# Patient Record
Sex: Female | Born: 2000 | Race: Black or African American | Hispanic: Yes | Marital: Single | Smoking: Never smoker
Health system: Southern US, Community
[De-identification: ages and names within clinical notes are randomized; demographics above are authoritative.]

## PROBLEM LIST (undated history)

## (undated) DIAGNOSIS — N390 Urinary tract infection, site not specified: Secondary | ICD-10-CM

## (undated) HISTORY — DX: Urinary tract infection, site not specified: N39.0

---
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
## 2020-09-03 ENCOUNTER — Other Ambulatory Visit: Payer: Self-pay

## 2020-09-03 ENCOUNTER — Emergency Department (HOSPITAL_BASED_OUTPATIENT_CLINIC_OR_DEPARTMENT_OTHER)
Admission: EM | Admit: 2020-09-03 | Discharge: 2020-09-03 | Discharge status: Against Medical Advice | Payer: Medicaid Other | Attending: Emergency Medicine | Admitting: Emergency Medicine

## 2020-09-03 ENCOUNTER — Encounter (HOSPITAL_BASED_OUTPATIENT_CLINIC_OR_DEPARTMENT_OTHER): Payer: Self-pay | Admitting: Emergency Medicine

## 2020-09-03 DIAGNOSIS — Z5321 Procedure and treatment not carried out due to patient leaving prior to being seen by health care provider: Secondary | ICD-10-CM | POA: Diagnosis not present

## 2020-09-03 DIAGNOSIS — N764 Abscess of vulva: Secondary | ICD-10-CM | POA: Diagnosis present

## 2020-09-03 LAB — PREGNANCY, URINE: Preg Test, Ur: NEGATIVE

## 2020-09-03 NOTE — ED Triage Notes (Addendum)
Reports a bump on her vagina that is sore.  Thinks its an abscess.  Noticed 4 days ago.  Also reports wanting to be tested for std's.  Reports a creamy discharge.  Was told something was going around.

## 2020-09-03 NOTE — ED Provider Notes (Signed)
Patient eloped prior to my examination.   Lorelee New, PA-C 09/03/20 1830    Rolan Bucco, MD 09/03/20 2227

## 2020-09-03 NOTE — ED Notes (Signed)
Patient called for room assignment. No answer

## 2020-09-05 ENCOUNTER — Encounter (HOSPITAL_BASED_OUTPATIENT_CLINIC_OR_DEPARTMENT_OTHER): Payer: Self-pay

## 2020-09-05 ENCOUNTER — Other Ambulatory Visit: Payer: Self-pay

## 2020-09-05 ENCOUNTER — Emergency Department (HOSPITAL_BASED_OUTPATIENT_CLINIC_OR_DEPARTMENT_OTHER)
Admission: EM | Admit: 2020-09-05 | Discharge: 2020-09-05 | Disposition: A | Discharge status: Home / Self Care | Payer: Medicaid Other | Attending: Emergency Medicine | Admitting: Emergency Medicine

## 2020-09-05 DIAGNOSIS — N751 Abscess of Bartholin's gland: Secondary | ICD-10-CM | POA: Diagnosis not present

## 2020-09-05 DIAGNOSIS — N898 Other specified noninflammatory disorders of vagina: Secondary | ICD-10-CM | POA: Diagnosis present

## 2020-09-05 DIAGNOSIS — N758 Other diseases of Bartholin's gland: Secondary | ICD-10-CM

## 2020-09-05 LAB — WET PREP, GENITAL
Trich, Wet Prep: NONE SEEN
Yeast Wet Prep HPF POC: NONE SEEN

## 2020-09-05 LAB — URINALYSIS, ROUTINE W REFLEX MICROSCOPIC
Bilirubin Urine: NEGATIVE
Glucose, UA: NEGATIVE mg/dL
Ketones, ur: NEGATIVE mg/dL
Leukocytes,Ua: NEGATIVE
Nitrite: NEGATIVE
Protein, ur: NEGATIVE mg/dL
Specific Gravity, Urine: 1.02 (ref 1.005–1.030)
pH: 7 (ref 5.0–8.0)

## 2020-09-05 LAB — URINALYSIS, MICROSCOPIC (REFLEX)

## 2020-09-05 LAB — PREGNANCY, URINE: Preg Test, Ur: NEGATIVE

## 2020-09-05 MED ORDER — LIDOCAINE-EPINEPHRINE (PF) 2 %-1:200000 IJ SOLN
10.0000 mL | Freq: Once | INTRAMUSCULAR | Status: AC
  Administered 2020-09-05: 10 mL
  Filled 2020-09-05: qty 20

## 2020-09-05 NOTE — ED Provider Notes (Signed)
[facility] EMERGENCY DEPARTMENT Provider Note   CSN: [id number] Arrival date & time: 09/05/20  0998     History Chief Complaint  Patient presents with  . Abscess    Renee [name] is a 20 y.o. female.  She has no significant past medical history.  Complaining of a bump in her vaginal area has been going on for a few days.  She is sexually active.  She is having some mild white discharge that is typical for her.  No bleeding.  Last menstrual period 3 weeks ago.  No fevers chills cough chest pain shortness of breath abdominal pain vomiting diarrhea  The history is provided by the patient.  Female GU Problem This is a new problem. The current episode started more than 2 days ago. The problem occurs constantly. The problem has been gradually worsening. Pertinent negatives include no chest pain, no abdominal pain, no headaches and no shortness of breath. Nothing aggravates the symptoms. Nothing relieves the symptoms. She has tried nothing for the symptoms. The treatment provided no relief.       History reviewed. No pertinent past medical history.  There are no problems to display for this patient.   History reviewed. No pertinent surgical history.   OB History   No obstetric history on file.     History reviewed. No pertinent family history.  Social History   Tobacco Use  . Smoking status: Never Smoker  . Smokeless tobacco: Never Used  Vaping Use  . Vaping Use: Never used  Substance Use Topics  . Alcohol use: Yes  . Drug use: Yes    Types: Marijuana    Home Medications Prior to Admission medications   Not on File    Allergies    Patient has no known allergies.  Review of Systems   Review of Systems  Constitutional: Negative for fever.  HENT: Negative for sore throat.   Eyes: Negative for visual disturbance.  Respiratory: Negative for shortness of breath.   Cardiovascular: Negative for chest pain.  Gastrointestinal: Negative for abdominal pain.   Genitourinary: Negative for dysuria.  Musculoskeletal: Negative for back pain.  Skin: Negative for rash.  Neurological: Negative for headaches.    Physical Exam Updated Vital Signs BP 132/74 (BP Location: Right Arm)   Pulse 90   Temp 98.3 F (36.8 C) (Oral)   Resp 16   Ht 5\' 2"  (1.575 m)   Wt 53.1 kg   LMP 08/09/2020   SpO2 100%   BMI 21.40 kg/m   Physical Exam Vitals and nursing note reviewed. Exam conducted with a chaperone present.  Constitutional:      General: She is not in acute distress.    Appearance: She is well-developed.  HENT:     Head: Normocephalic and atraumatic.  Eyes:     Conjunctiva/sclera: Conjunctivae normal.  Cardiovascular:     Rate and Rhythm: Normal rate and regular rhythm.     Heart sounds: No murmur heard.   Pulmonary:     Effort: Pulmonary effort is normal. No respiratory distress.     Breath sounds: Normal breath sounds.  Abdominal:     Palpations: Abdomen is soft.     Tenderness: There is no abdominal tenderness.  Genitourinary:    Comments: Normal external genitalia.  Scant white discharge in vault.  Tender to centimeter mass right vulvar area consistent with Bartholin cyst.  I&D of same.  Purulent discharge.  See I&D procedure note.  Nurse 08/11/2020 present and chaperoning for exam. Musculoskeletal:  General: Normal range of motion.     Cervical back: Neck supple.  Skin:    General: Skin is warm and dry.  Neurological:     Mental Status: She is alert.     ED Results / Procedures / Treatments   Labs (all labs ordered are listed, but only abnormal results are displayed) Labs Reviewed  WET PREP, GENITAL - Abnormal; Notable for the following components:      Result Value   Clue Cells Wet Prep HPF POC PRESENT (*)    WBC, Wet Prep HPF POC MODERATE (*)    All other components within normal limits  URINALYSIS, ROUTINE W REFLEX MICROSCOPIC - Abnormal; Notable for the following components:   APPearance CLOUDY (*)    Hgb urine  dipstick TRACE (*)    All other components within normal limits  URINALYSIS, MICROSCOPIC (REFLEX) - Abnormal; Notable for the following components:   Bacteria, UA FEW (*)    All other components within normal limits  PREGNANCY, URINE  GC/CHLAMYDIA PROBE AMP ([place]) NOT AT [facility]    EKG None  Radiology No results found.  Procedures .Marland KitchenIncision and Drainage  Date/Time: 09/05/2020 7:43 AM Performed by: Terrilee Files, MD Authorized by: Terrilee Files, MD   Consent:    Consent obtained:  Verbal   Consent given by:  Patient   Risks discussed:  Bleeding, incomplete drainage, pain and infection   Alternatives discussed:  No treatment, delayed treatment and referral Universal protocol:    Procedure explained and questions answered to patient or proxy's satisfaction: yes     Patient identity confirmed:  Verbally with patient Location:    Type:  Bartholin cyst   Size:  3   Location:  Anogenital   Anogenital location:  Bartholin's gland Pre-procedure details:    Skin preparation:  Povidone-iodine Sedation:    Sedation type:  None Anesthesia:    Anesthesia method:  Local infiltration   Local anesthetic:  Lidocaine 2% WITH epi Procedure type:    Complexity:  Complex Procedure details:    Ultrasound guidance: no     Incision types:  Single straight   Incision depth:  Subcutaneous   Wound management:  Probed and deloculated   Drainage:  Purulent   Drainage amount:  Moderate   Wound treatment:  Wound left open   Packing materials:  None Post-procedure details:    Procedure completion:  Tolerated well, no immediate complications     Medications Ordered in ED Medications  lidocaine-EPINEPHrine (XYLOCAINE W/EPI) 2 %-1:200000 (PF) injection 10 mL (has no administration in time range)    ED Course  I have reviewed the triage vital signs and the nursing notes.  Pertinent labs & imaging results that were available during my care of the patient were reviewed by me  and considered in my medical decision making (see chart for details).    MDM Rules/Calculators/A&P                         Differential diagnosis includes Bartholin cyst, abscess, UTI, pregnancy  Final Clinical Impression(s) / ED Diagnoses Final diagnoses:  Bartholin's gland infection    Rx / DC Orders ED Discharge Orders    None       Terrilee Files, MD 09/05/20 [phone]

## 2020-09-05 NOTE — ED Triage Notes (Signed)
Pt reports bump to vaginal area that is sore. Also reports creamy vaginal discharge. Is wanting to get tested for STIs.

## 2020-09-06 LAB — GC/CHLAMYDIA PROBE AMP (~~LOC~~) NOT AT ARMC
Chlamydia: NEGATIVE
Comment: NEGATIVE
Comment: NORMAL
Neisseria Gonorrhea: NEGATIVE

## 2021-03-27 ENCOUNTER — Emergency Department (HOSPITAL_BASED_OUTPATIENT_CLINIC_OR_DEPARTMENT_OTHER): Payer: Medicaid Other

## 2021-03-27 ENCOUNTER — Other Ambulatory Visit: Payer: Self-pay

## 2021-03-27 ENCOUNTER — Encounter (HOSPITAL_BASED_OUTPATIENT_CLINIC_OR_DEPARTMENT_OTHER): Payer: Self-pay | Admitting: *Deleted

## 2021-03-27 ENCOUNTER — Emergency Department (HOSPITAL_BASED_OUTPATIENT_CLINIC_OR_DEPARTMENT_OTHER)
Admission: EM | Admit: 2021-03-27 | Discharge: 2021-03-27 | Disposition: A | Discharge status: Home / Self Care | Payer: Medicaid Other | Attending: Emergency Medicine | Admitting: Emergency Medicine

## 2021-03-27 DIAGNOSIS — R102 Pelvic and perineal pain: Secondary | ICD-10-CM | POA: Diagnosis present

## 2021-03-27 DIAGNOSIS — N898 Other specified noninflammatory disorders of vagina: Secondary | ICD-10-CM

## 2021-03-27 DIAGNOSIS — N83209 Unspecified ovarian cyst, unspecified side: Secondary | ICD-10-CM

## 2021-03-27 LAB — URINALYSIS, ROUTINE W REFLEX MICROSCOPIC
Bilirubin Urine: NEGATIVE
Glucose, UA: NEGATIVE mg/dL
Hgb urine dipstick: NEGATIVE
Ketones, ur: NEGATIVE mg/dL
Leukocytes,Ua: NEGATIVE
Nitrite: NEGATIVE
Protein, ur: NEGATIVE mg/dL
Specific Gravity, Urine: 1.025 (ref 1.005–1.030)
pH: 7.5 (ref 5.0–8.0)

## 2021-03-27 LAB — WET PREP, GENITAL
Sperm: NONE SEEN
Trich, Wet Prep: NONE SEEN
WBC, Wet Prep HPF POC: >=10 — AB (ref <10)
Yeast Wet Prep HPF POC: NONE SEEN

## 2021-03-27 LAB — PREGNANCY, URINE: Preg Test, Ur: NEGATIVE

## 2021-03-27 MED ORDER — METRONIDAZOLE 500 MG PO TABS: 500.0000 mg | ORAL_TABLET | Freq: Two times a day (BID) | ORAL | 0 refills | Status: DC

## 2021-03-27 NOTE — Discharge Instructions (Addendum)
It was our pleasure to provide your ER care today - we hope that you feel better.  Take acetaminophen or ibuprofen as need. Take antibiotic as prescribed.   Your ultrasound shows a small ovarian cyst - follow up with primary care doctor and gyn doctor. Redemonstrated cyst between the right and left ovaries measuring 1.3 cm, possible paraovarian cyst  Follow up with primary care doctor in the coming week for recheck.  Return to ER if worse, new symptoms, fevers, new or severe pain, persistent vomiting, or other concern.

## 2021-03-27 NOTE — ED Triage Notes (Signed)
C/o pelvic " pressure" x 1 week , denies vaginal discharge

## 2021-03-27 NOTE — ED Provider Notes (Signed)
[facility] EMERGENCY DEPARTMENT Provider Note   CSN: [id number] Arrival date & time: 03/27/21  1541     History Chief Complaint  Patient presents with   Pelvic Pain    Renee [name] is a 20 y.o. female.  Patient c/o bilateral pelvic pain in past week. Symptoms gradual onset, constant, moderate, dull, non radiating. No nausea or vomiting. No abd distension. No diarrhea or constipation. No fever or chills. Hx ovarian cyst. No current vaginal discharge or bleeding. Is sexually active, no known std exposure. No hx endometriosis or fibroids. Normal appetite. No back or flank pain. No dysuria. Lnmp 2 week ago.   The history is provided by the patient and medical records.  Pelvic Pain Associated symptoms include abdominal pain. Pertinent negatives include no chest pain, no headaches and no shortness of breath.      History reviewed. No pertinent past medical history.  There are no problems to display for this patient.   History reviewed. No pertinent surgical history.   OB History   No obstetric history on file.     No family history on file.  Social History   Tobacco Use   Smoking status: Never   Smokeless tobacco: Never  Vaping Use   Vaping Use: Never used  Substance Use Topics   Alcohol use: Yes   Drug use: Yes    Types: Marijuana    Home Medications Prior to Admission medications   Not on File    Allergies    Patient has no known allergies.  Review of Systems   Review of Systems  Constitutional:  Negative for chills and fever.  HENT:  Negative for sore throat.   Eyes:  Negative for redness.  Respiratory:  Negative for shortness of breath.   Cardiovascular:  Negative for chest pain.  Gastrointestinal:  Positive for abdominal pain. Negative for diarrhea, nausea and vomiting.  Genitourinary:  Positive for pelvic pain. Negative for dysuria, flank pain, urgency, vaginal bleeding and vaginal discharge.  Musculoskeletal:  Negative for back pain  and neck pain.  Skin:  Negative for rash.  Neurological:  Negative for headaches.  Hematological:  Does not bruise/bleed easily.  Psychiatric/Behavioral:  Negative for confusion.    Physical Exam Updated Vital Signs BP 124/81 (BP Location: Right Arm)   Pulse 71   Temp 98.7 F (37.1 C) (Oral)   Resp 16   Ht 1.575 m (5\' 2" )   Wt 54.4 kg   LMP 03/16/2021   BMI 21.95 kg/m   Physical Exam Vitals and nursing note reviewed.  Constitutional:      Appearance: Normal appearance. She is well-developed.  HENT:     Head: Atraumatic.     Nose: Nose normal.     Mouth/Throat:     Mouth: Mucous membranes are moist.  Eyes:     General: No scleral icterus.    Conjunctiva/sclera: Conjunctivae normal.  Neck:     Trachea: No tracheal deviation.  Cardiovascular:     Rate and Rhythm: Normal rate and regular rhythm.     Pulses: Normal pulses.     Heart sounds: Normal heart sounds. No murmur heard.   No friction rub. No gallop.  Pulmonary:     Effort: Pulmonary effort is normal. No respiratory distress.     Breath sounds: Normal breath sounds.  Abdominal:     General: Bowel sounds are normal. There is no distension.     Palpations: Abdomen is soft. There is no mass.     Tenderness:  There is no abdominal tenderness. There is no guarding or rebound.     Hernia: No hernia is present.  Genitourinary:    Comments: No cva tenderness. Mild whitish d/c. No CMT. No adnexal masses or tenderness noted.  Musculoskeletal:        General: No swelling.     Cervical back: Normal range of motion and neck supple. No rigidity. No muscular tenderness.  Skin:    General: Skin is warm and dry.     Findings: No rash.  Neurological:     Mental Status: She is alert.     Comments: Alert, speech normal.   Psychiatric:        Mood and Affect: Mood normal.    ED Results / Procedures / Treatments   Labs (all labs ordered are listed, but only abnormal results are displayed) Results for orders placed or  performed during the hospital encounter of 03/27/21  Wet prep, genital   Specimen: Genital  Result Value Ref Range   Yeast Wet Prep HPF POC NONE SEEN NONE SEEN   Trich, Wet Prep NONE SEEN NONE SEEN   Clue Cells Wet Prep HPF POC PRESENT (A) NONE SEEN   WBC, Wet Prep HPF POC >=10 (A) <10   Sperm NONE SEEN   Pregnancy, urine  Result Value Ref Range   Preg Test, Ur NEGATIVE NEGATIVE  Urinalysis, Routine w reflex microscopic  Result Value Ref Range   Color, Urine YELLOW YELLOW   APPearance CLOUDY (A) CLEAR   Specific Gravity, Urine 1.025 1.005 - 1.030   pH 7.5 5.0 - 8.0   Glucose, UA NEGATIVE NEGATIVE mg/dL   Hgb urine dipstick NEGATIVE NEGATIVE   Bilirubin Urine NEGATIVE NEGATIVE   Ketones, ur NEGATIVE NEGATIVE mg/dL   Protein, ur NEGATIVE NEGATIVE mg/dL   Nitrite NEGATIVE NEGATIVE   Leukocytes,Ua NEGATIVE NEGATIVE    EKG None  Radiology US PELVIC COMPLETE W TRANSVAGINAL AND TORSION R/O  Result Date: 03/27/2021 CLINICAL DATA:  Pelvic pain EXAM: TRANSABDOMINAL AND TRANSVAGINAL ULTRASOUND OF PELVIS DOPPLER ULTRASOUND OF OVARIES TECHNIQUE: Both transabdominal and transvaginal ultrasound examinations of the pelvis were performed. Transabdominal technique was performed for global imaging of the pelvis including uterus, ovaries, adnexal regions, and pelvic cul-de-sac. It was necessary to proceed with endovaginal exam following the transabdominal exam to visualize the uterus endometrium ovaries. Color and duplex Doppler ultrasound was utilized to evaluate blood flow to the ovaries. COMPARISON:  Ultrasound 05/11/2019 FINDINGS: Uterus Measurements: 5.8 x 3.7 x 4 cm = volume: 45 mL. No fibroids or other mass visualized. Endometrium Thickness: 5.3.  No focal abnormality visualized. Right ovary Measurements: 3.3 x 1.7 x 3.9 cm = volume: 11.6 mL. Normal appearance/no adnexal mass. Left ovary Measurements: 3.5 x 2.5 x 3.3 cm = volume: 15.3 mL. Normal appearance/no adnexal mass. Pulsed Doppler  evaluation of both ovaries demonstrates normal low-resistance arterial and venous waveforms. Other findings Small free fluid in the pelvis. Redemonstrated cyst between the right and left ovaries measuring 1.3 cm, possible paraovarian cyst, no further follow-up recommended. IMPRESSION: Small free fluid in the pelvis.  Negative for torsion. Electronically Signed   By: Jasmine Pang M.D.   On: 03/27/2021 19:38    Procedures Procedures   Medications Ordered in ED Medications - No data to display  ED Course  I have reviewed the triage vital signs and the nursing notes.  Pertinent labs & imaging results that were available during my care of the patient were reviewed by me and considered in my medical  decision making (see chart for details).    MDM Rules/Calculators/A&P                          Labs sent.   Reviewed nursing notes and prior charts for additional history. Prior u/s w small ovarian cysts. Prior gc/ch tests negative.   Labs reviewed/interpreted by me -  u preg neg. Ua neg for uti.   U/s reviewed/interpreted by me - small ovarian cyst.  Recheck abd soft nt. Vitals normal.  Pt appears stable for d/c.   Rec pcp/gyn outpatient f/u.  Return precautions provided.      Final Clinical Impression(s) / ED Diagnoses Final diagnoses:  None    Rx / DC Orders ED Discharge Orders     None        Cathren Laine, MD 03/27/21 2130

## 2021-03-28 LAB — GC/CHLAMYDIA PROBE AMP (~~LOC~~) NOT AT ARMC
Chlamydia: NEGATIVE
Comment: NEGATIVE
Comment: NORMAL
Neisseria Gonorrhea: NEGATIVE

## 2021-06-19 ENCOUNTER — Other Ambulatory Visit: Payer: Self-pay

## 2021-06-19 ENCOUNTER — Encounter (HOSPITAL_BASED_OUTPATIENT_CLINIC_OR_DEPARTMENT_OTHER): Payer: Self-pay | Admitting: Emergency Medicine

## 2021-06-19 ENCOUNTER — Emergency Department (HOSPITAL_BASED_OUTPATIENT_CLINIC_OR_DEPARTMENT_OTHER)
Admission: EM | Admit: 2021-06-19 | Discharge: 2021-06-19 | Disposition: A | Discharge status: Home / Self Care | Payer: Medicaid Other | Attending: Emergency Medicine | Admitting: Emergency Medicine

## 2021-06-19 DIAGNOSIS — N898 Other specified noninflammatory disorders of vagina: Secondary | ICD-10-CM | POA: Insufficient documentation

## 2021-06-19 DIAGNOSIS — Z5321 Procedure and treatment not carried out due to patient leaving prior to being seen by health care provider: Secondary | ICD-10-CM | POA: Insufficient documentation

## 2021-06-19 DIAGNOSIS — Z202 Contact with and (suspected) exposure to infections with a predominantly sexual mode of transmission: Secondary | ICD-10-CM | POA: Diagnosis not present

## 2021-06-19 NOTE — ED Triage Notes (Signed)
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  No known outbreaks.  Pt states she wants to be tested for STD's in general.  Some vaginal discharge.  No itching.  No known fever.

## 2021-09-13 ENCOUNTER — Emergency Department (HOSPITAL_BASED_OUTPATIENT_CLINIC_OR_DEPARTMENT_OTHER)
Admission: EM | Admit: 2021-09-13 | Discharge: 2021-09-13 | Disposition: A | Discharge status: Home / Self Care | Payer: Medicaid Other | Attending: Emergency Medicine | Admitting: Emergency Medicine

## 2021-09-13 ENCOUNTER — Emergency Department (HOSPITAL_BASED_OUTPATIENT_CLINIC_OR_DEPARTMENT_OTHER): Payer: Medicaid Other

## 2021-09-13 ENCOUNTER — Other Ambulatory Visit: Payer: Self-pay

## 2021-09-13 ENCOUNTER — Encounter (HOSPITAL_BASED_OUTPATIENT_CLINIC_OR_DEPARTMENT_OTHER): Payer: Self-pay | Admitting: Emergency Medicine

## 2021-09-13 DIAGNOSIS — L089 Local infection of the skin and subcutaneous tissue, unspecified: Secondary | ICD-10-CM | POA: Diagnosis not present

## 2021-09-13 DIAGNOSIS — R519 Headache, unspecified: Secondary | ICD-10-CM | POA: Diagnosis present

## 2021-09-13 LAB — BASIC METABOLIC PANEL
Anion gap: 8 (ref 5–15)
BUN: 7 mg/dL (ref 6–20)
CO2: 27 mmol/L (ref 22–32)
Calcium: 9.1 mg/dL (ref 8.9–10.3)
Chloride: 101 mmol/L (ref 98–111)
Creatinine, Ser: 0.69 mg/dL (ref 0.44–1.00)
GFR, Estimated: >60 mL/min (ref >60)
Glucose, Bld: 86 mg/dL (ref 70–99)
Potassium: 4.2 mmol/L (ref 3.5–5.1)
Sodium: 136 mmol/L (ref 135–145)

## 2021-09-13 LAB — CBC WITH DIFFERENTIAL/PLATELET
Abs Immature Granulocytes: 0.03 10*3/uL (ref 0.00–0.07)
Basophils Absolute: 0.1 10*3/uL (ref 0.0–0.1)
Basophils Relative: 1 %
Eosinophils Absolute: 0.1 10*3/uL (ref 0.0–0.5)
Eosinophils Relative: 0 %
HCT: 40.5 % (ref 36.0–46.0)
Hemoglobin: 13.9 g/dL (ref 12.0–15.0)
Immature Granulocytes: 0 %
Lymphocytes Relative: 28 %
Lymphs Abs: 3.2 10*3/uL (ref 0.7–4.0)
MCH: 29.4 pg (ref 26.0–34.0)
MCHC: 34.3 g/dL (ref 30.0–36.0)
MCV: 85.8 fL (ref 80.0–100.0)
Monocytes Absolute: 1 10*3/uL (ref 0.1–1.0)
Monocytes Relative: 8 %
Neutro Abs: 7.2 10*3/uL (ref 1.7–7.7)
Neutrophils Relative %: 63 %
Platelets: 303 10*3/uL (ref 150–400)
RBC: 4.72 MIL/uL (ref 3.87–5.11)
RDW: 13.2 % (ref 11.5–15.5)
WBC: 11.5 10*3/uL — ABNORMAL HIGH (ref 4.0–10.5)
nRBC: 0 % (ref 0.0–0.2)

## 2021-09-13 MED ORDER — HYDROCODONE-ACETAMINOPHEN 5-325 MG PO TABS: 1.0000 | ORAL_TABLET | Freq: Four times a day (QID) | ORAL | 0 refills | Status: DC | PRN

## 2021-09-13 MED ORDER — AMOXICILLIN-POT CLAVULANATE 875-125 MG PO TABS: 1.0000 | ORAL_TABLET | Freq: Two times a day (BID) | ORAL | 0 refills | Status: DC

## 2021-09-13 MED ORDER — IOHEXOL 300 MG/ML  SOLN
75.0000 mL | Freq: Once | INTRAMUSCULAR | Status: AC | PRN
Start: 2021-09-13 — End: 2021-09-13
  Administered 2021-09-13: 75 mL via INTRAVENOUS
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
## 2021-09-13 NOTE — ED Provider Notes (Signed)
[facility] EMERGENCY DEPARTMENT Provider Note   CSN: [id number] Arrival date & time: 09/13/21  1941     History  Chief Complaint  Patient presents with   Facial Pain    Renee [name] is a 21 y.o. female.  Presents emergency department chief complaint of left facial swelling and pain.  Patient states that week ago her dogs were fighting and she got appalled to the left side of her face.  She was doing well the next day but then began having swelling which has progressively worsened.  She has associated trismus, difficulty sleeping due to pain.  She feels like she is having abnormal movement in her jaw when she tries to open it.  She denies sore throat, ear pain, difficulty swallowing, fever or chills.  HPI     Home Medications Prior to Admission medications   Medication Sig Start Date End Date Taking? Authorizing Provider  metroNIDAZOLE (FLAGYL) 500 MG tablet Take 1 tablet (500 mg total) by mouth 2 (two) times daily. 03/27/21   Lajean Saver, MD      Allergies    Patient has no known allergies.    Review of Systems   Review of Systems  Physical Exam Updated Vital Signs BP 131/72   Pulse 85   Temp 98.8 F (37.1 C) (Oral)   Resp 16   Ht 5\' 1"  (1.549 m)   Wt 54.4 kg   LMP 08/30/2021   SpO2 99%   BMI 22.67 kg/m  Physical Exam Vitals and nursing note reviewed.  Constitutional:      General: She is not in acute distress.    Appearance: She is well-developed. She is not diaphoretic.  HENT:     Head: Normocephalic and atraumatic.     Comments: Swelling over the left parotid region, 2 finger trismus, visualized portions of the palate show no significant swelling or tonsil swelling or exudate.    Right Ear: External ear normal.     Left Ear: External ear normal.     Nose: Nose normal.     Mouth/Throat:     Mouth: Mucous membranes are moist.  Eyes:     General: No scleral icterus.    Conjunctiva/sclera: Conjunctivae normal.  Cardiovascular:     Rate and  Rhythm: Normal rate and regular rhythm.     Heart sounds: Normal heart sounds. No murmur heard.   No friction rub. No gallop.  Pulmonary:     Effort: Pulmonary effort is normal. No respiratory distress.     Breath sounds: Normal breath sounds.  Abdominal:     General: Bowel sounds are normal. There is no distension.     Palpations: Abdomen is soft. There is no mass.     Tenderness: There is no abdominal tenderness. There is no guarding.  Musculoskeletal:     Cervical back: Normal range of motion.  Skin:    General: Skin is warm and dry.  Neurological:     Mental Status: She is alert and oriented to person, place, and time.  Psychiatric:        Behavior: Behavior normal.    ED Results / Procedures / Treatments   Labs (all labs ordered are listed, but only abnormal results are displayed) Labs Reviewed - No data to display  EKG None  Radiology No results found.  Procedures Procedures    Medications Ordered in ED Medications - No data to display  ED Course/ Medical Decision Making/ A&P Clinical Course as of 09/13/21 2345  Wed  Sep 13, 2021  2341 WBC(!): 11.5 [AH]  123XX123 Basic metabolic panel [AH]  123XX123 CT Soft Tissue Neck W Contrast I visualized and interpreted the CT soft tissue of the neck came there is inflammatory stranding in the left cheek, no evidence of parotitis fluid collection suggestive of abscess [AH]    Clinical Course User Index [AH] Margarita Mail, PA-C                           Medical Decision Making 21 year old female who got a dog ball to the face 1 week ago with worsening swelling.  After review of all data points she appears to have inflammatory stranding in the left cheek.  This is likely infectious in etiology.  Patient be treated with Augmentin.  She has no evidence of parotitis, dental infection.  PDMP reviewed and pain meds ordered as patient has significant trismus, throbbing facial pain and difficulty sleeping.  Discussed outpatient follow-up  and return  Problems Addressed: Facial infection: acute illness or injury  Amount and/or Complexity of Data Reviewed Labs: ordered. Radiology: ordered.  Risk Prescription drug management.      Final Clinical Impression(s) / ED Diagnoses Final diagnoses:  Facial infection    Rx / DC Orders ED Discharge Orders     None         Margarita Mail, PA-C 09/13/21 2347    Drenda Freeze, MD 09/14/21 1556

## 2021-09-13 NOTE — Discharge Instructions (Signed)
Get help right away if: Your symptoms get worse. You feel very sleepy. You develop vomiting or diarrhea that persists. You notice red streaks coming from the infected area. Your red area gets larger or turns dark in color. 

## 2021-12-06 IMAGING — US US PELVIS COMPLETE TRANSABD/TRANSVAG W DUPLEX
1 series · 13 of 25 positions shown · non-contrast
Comparison: Ultrasound 05/11/2019

CLINICAL DATA: Pelvic pain

EXAM:
TRANSABDOMINAL AND TRANSVAGINAL ULTRASOUND OF PELVIS
DOPPLER ULTRASOUND OF OVARIES
TECHNIQUE: Both transabdominal and transvaginal ultrasound examinations of the
pelvis were performed. Transabdominal technique was performed for
global imaging of the pelvis including uterus, ovaries, adnexal
regions, and pelvic cul-de-sac.
It was necessary to proceed with endovaginal exam following the
transabdominal exam to visualize the uterus endometrium ovaries.
Color and duplex Doppler ultrasound was utilized to evaluate blood
flow to the ovaries.

[Series 1: us pelvis complete transabd/transvag w duplex · 13 of 121 slices shown]
[im 1/121]
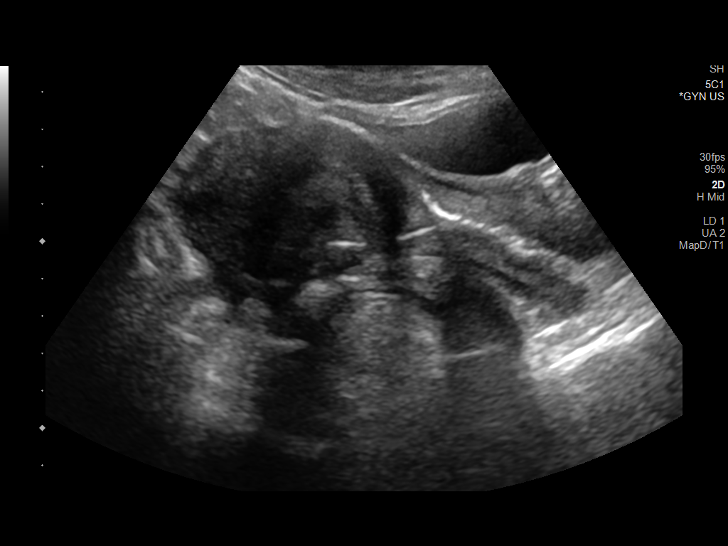
[im 11/121]
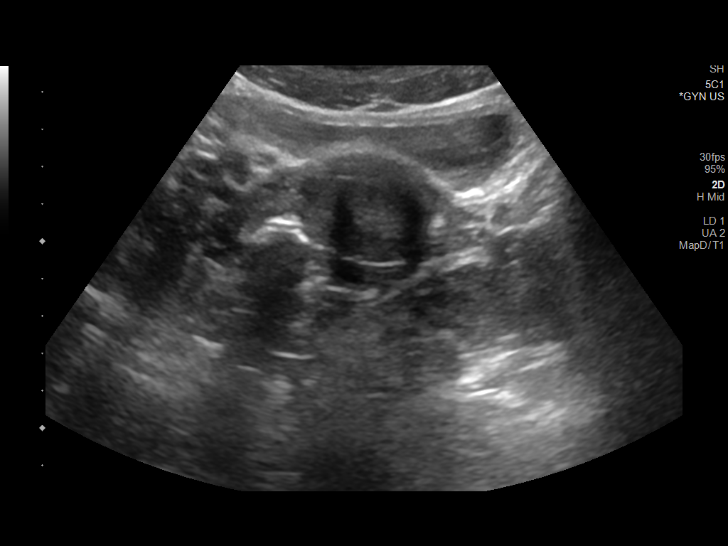
[im 21/121]
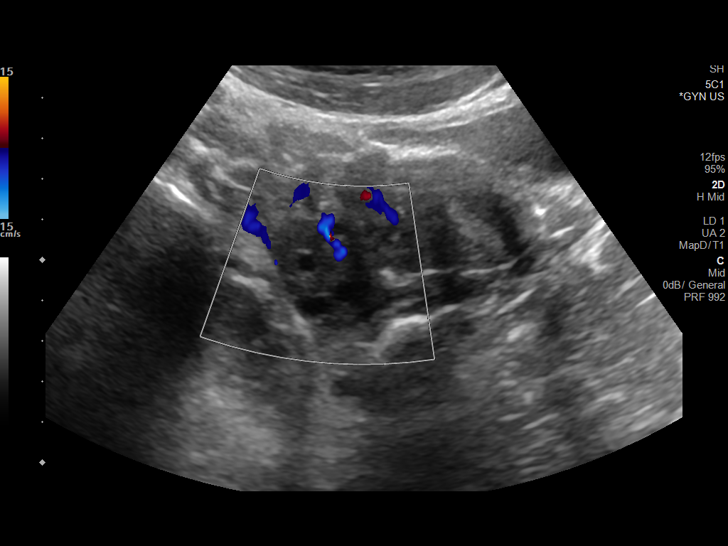
[im 31/121]
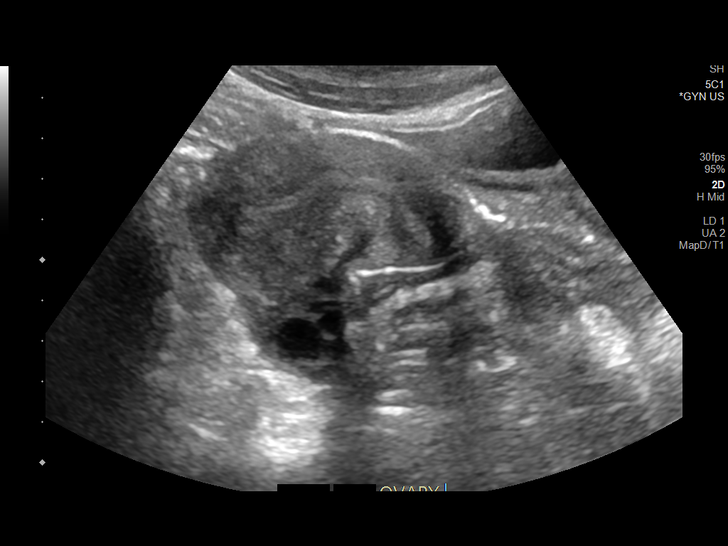
[im 41/121]
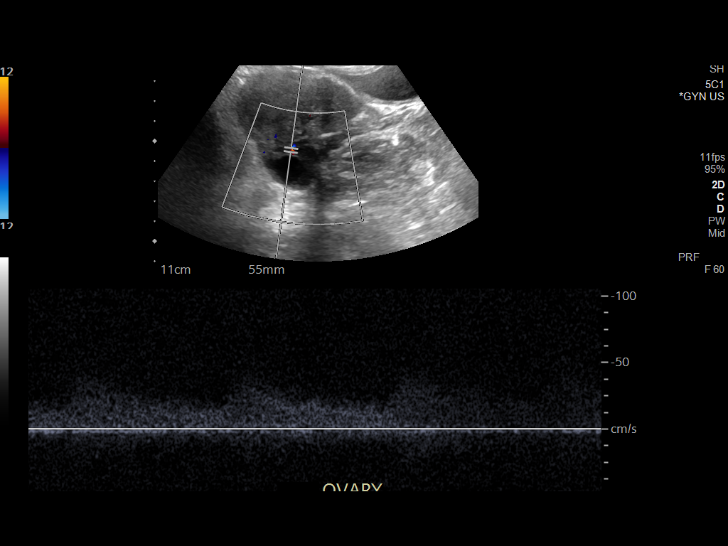
[im 51/121]
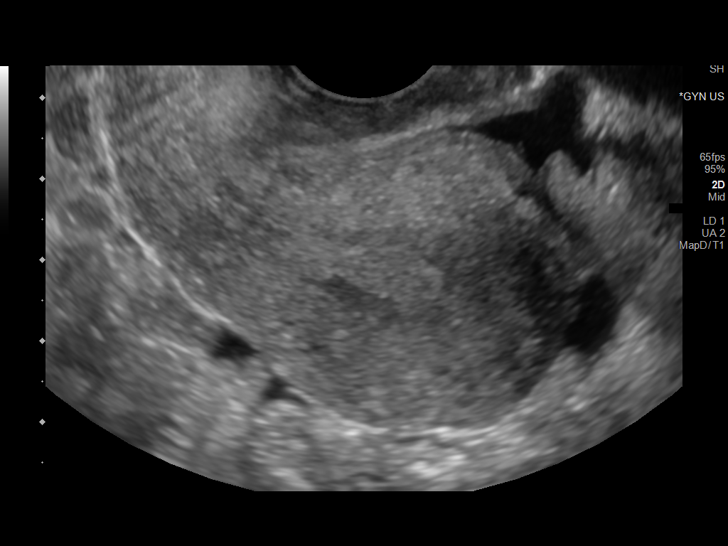
[im 61/121]
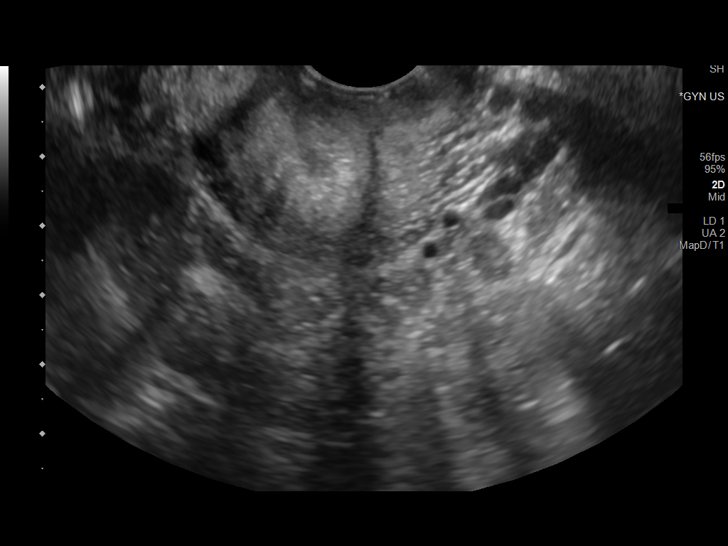
[im 71/121]
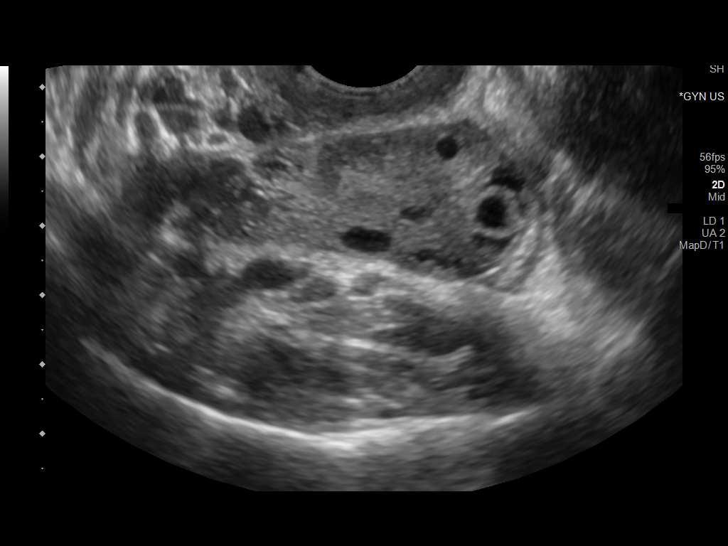
[im 81/121]
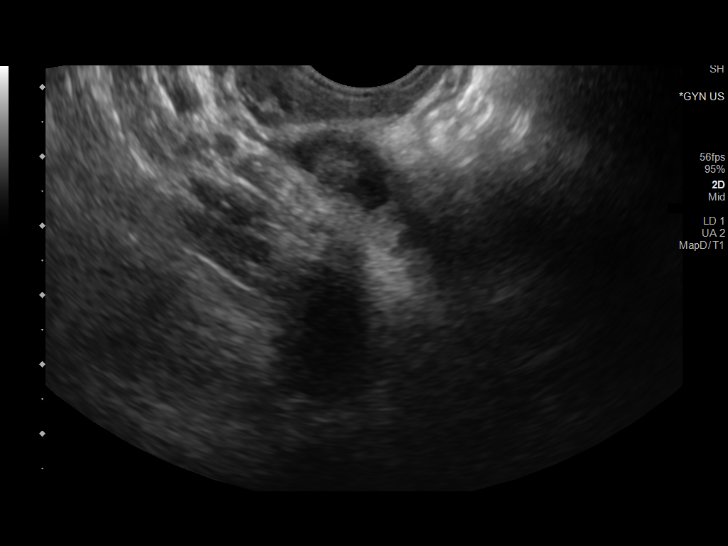
[im 91/121]
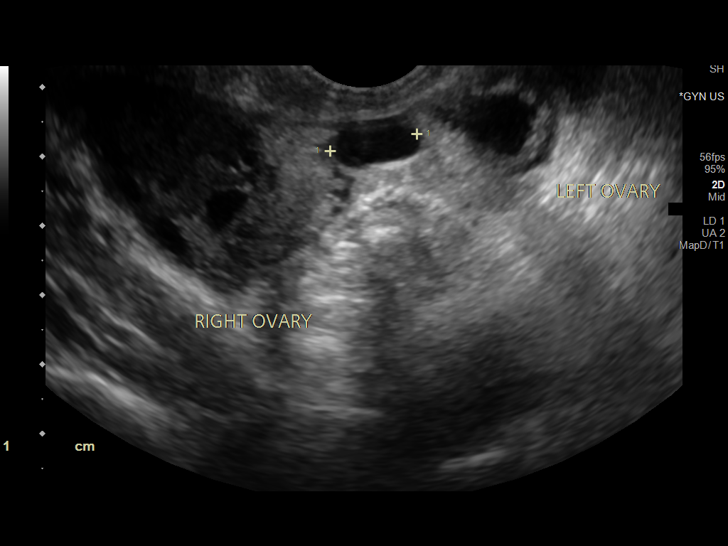
[im 101/121]
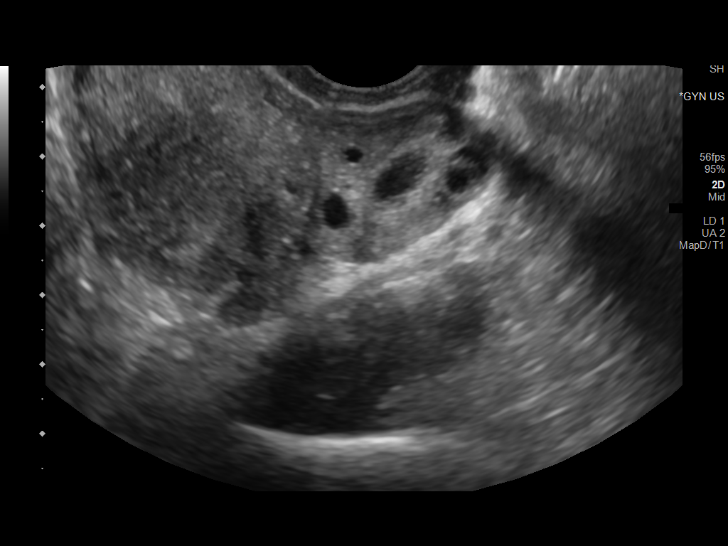
[im 111/121]
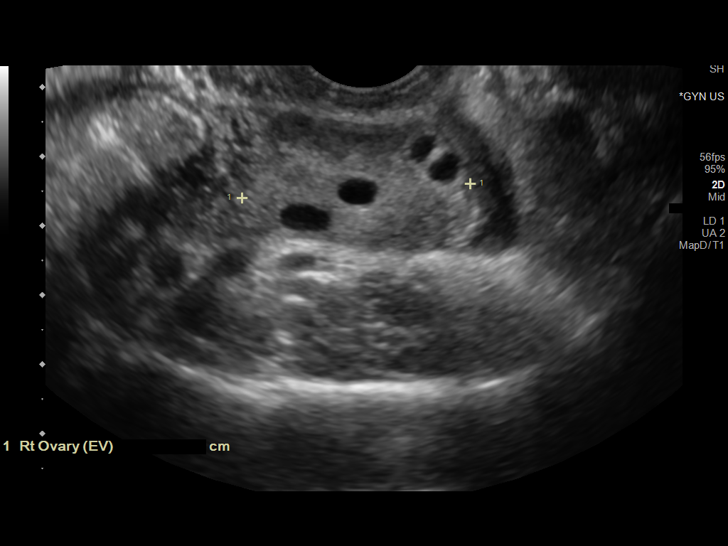
[im 121/121]
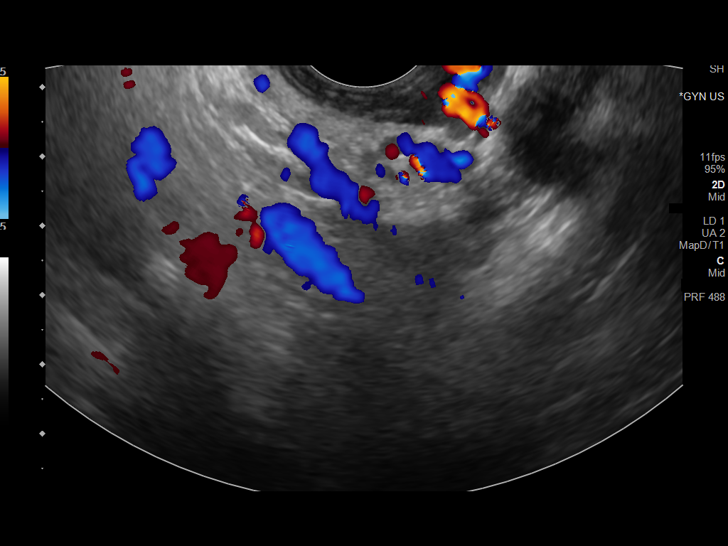

[13 of 25 positions shown; findings below may reference images not displayed]

FINDINGS: Uterus

Measurements: 5.8 x 3.7 x 4 cm = volume: 45 mL. No fibroids or other
mass visualized.

Endometrium

Thickness: 5.3.  No focal abnormality visualized.

Right ovary

Measurements: 3.3 x 1.7 x 3.9 cm = volume: 11.6 mL. Normal
appearance/no adnexal mass.

Left ovary

Measurements: 3.5 x 2.5 x 3.3 cm = volume: 15.3 mL. Normal
appearance/no adnexal mass.

Pulsed Doppler evaluation of both ovaries demonstrates normal
low-resistance arterial and venous waveforms.

Other findings

Small free fluid in the pelvis. Redemonstrated cyst between the
right and left ovaries measuring 1.3 cm, possible paraovarian cyst,
no further follow-up recommended.
IMPRESSION: Small free fluid in the pelvis.  Negative for torsion.

## 2021-12-08 ENCOUNTER — Encounter (HOSPITAL_BASED_OUTPATIENT_CLINIC_OR_DEPARTMENT_OTHER): Payer: Self-pay

## 2021-12-08 ENCOUNTER — Emergency Department (HOSPITAL_BASED_OUTPATIENT_CLINIC_OR_DEPARTMENT_OTHER)
Admission: EM | Admit: 2021-12-08 | Discharge: 2021-12-08 | Discharge status: Against Medical Advice | Payer: Medicaid Other | Attending: Emergency Medicine | Admitting: Emergency Medicine

## 2021-12-08 ENCOUNTER — Other Ambulatory Visit: Payer: Self-pay

## 2021-12-08 DIAGNOSIS — N750 Cyst of Bartholin's gland: Secondary | ICD-10-CM | POA: Diagnosis present

## 2021-12-08 DIAGNOSIS — Z5321 Procedure and treatment not carried out due to patient leaving prior to being seen by health care provider: Secondary | ICD-10-CM | POA: Diagnosis not present

## 2021-12-08 NOTE — ED Notes (Signed)
Called x 3, no response

## 2021-12-08 NOTE — ED Triage Notes (Addendum)
Bartholin's cyst x 3 days ago - states has history of them.
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
## 2023-02-14 ENCOUNTER — Ambulatory Visit
Admission: EM | Admit: 2023-02-14 | Discharge: 2023-02-14 | Disposition: A | Discharge status: Home / Self Care | Payer: Medicaid Other | Attending: Family Medicine | Admitting: Family Medicine

## 2023-02-14 ENCOUNTER — Ambulatory Visit
Admission: EM | Admit: 2023-02-14 | Discharge: 2023-02-14 | Discharge status: Absent without leave | Payer: Medicaid Other

## 2023-02-14 DIAGNOSIS — R300 Dysuria: Secondary | ICD-10-CM

## 2023-02-14 DIAGNOSIS — N3001 Acute cystitis with hematuria: Secondary | ICD-10-CM | POA: Diagnosis present

## 2023-02-14 LAB — POCT URINE PREGNANCY: Preg Test, Ur: NEGATIVE

## 2023-02-14 LAB — POCT URINALYSIS DIP (MANUAL ENTRY)
Bilirubin, UA: NEGATIVE
Glucose, UA: NEGATIVE mg/dL
Nitrite, UA: POSITIVE — AB
Protein Ur, POC: 100 mg/dL — AB
Spec Grav, UA: >=1.03 — AB (ref 1.010–1.025)
Urobilinogen, UA: 0.2 U/dL
pH, UA: 5.5 (ref 5.0–8.0)

## 2023-02-14 MED ORDER — SULFAMETHOXAZOLE-TRIMETHOPRIM 800-160 MG PO TABS: 1.0000 | ORAL_TABLET | Freq: Two times a day (BID) | ORAL | 0 refills | Status: AC

## 2023-02-14 NOTE — Discharge Instructions (Addendum)
Instructed patient to take medication as directed with food to completion.  Encouraged to increase daily water intake to 64 ounces per day while taking these medications.  Advised we will follow-up with urine culture results once received.  Advised if symptoms worsen and/or unresolved please follow-up with PCP or here for further evaluation.

## 2023-02-14 NOTE — ED Triage Notes (Signed)
Pt with c/o burning with urination and lower abdominal pain that started last night.

## 2023-02-14 NOTE — ED Provider Notes (Signed)
[facility]    CSN: [id number] Arrival date & time: 02/14/23  1523      History   Chief Complaint Chief Complaint  Patient presents with   Dysuria    HPI Renee [name] is a 22 y.o. [name].   HPI Renee 22 year old [name] complains of dysuria and lower abdominal pain that began last night.  History reviewed. No pertinent past medical history.  There are no problems to display for this patient.   History reviewed. No pertinent surgical history.  OB History   No obstetric history on file.      Home Medications    Prior to Admission medications   Medication Sig Start Date End Date Taking? Authorizing Provider  sulfamethoxazole-trimethoprim (BACTRIM DS) 800-160 MG tablet Take 1 tablet by mouth 2 (two) times daily for 7 days. 02/14/23 02/21/23 Yes Trevor Iha, FNP    Family History History reviewed. No pertinent family history.  Social History Social History   Tobacco Use   Smoking status: Never   Smokeless tobacco: Never  Vaping Use   Vaping status: Every Day   Substances: Nicotine  Substance Use Topics   Alcohol use: Yes    Comment: social   Drug use: Not Currently    Types: Marijuana     Allergies   Patient has no known allergies.   Review of Systems Review of Systems  Genitourinary:  Positive for dysuria.  All other systems reviewed and are negative.    Physical Exam Triage Vital Signs ED Triage Vitals  Encounter Vitals Group     BP      Systolic BP Percentile      Diastolic BP Percentile      Pulse      Resp      Temp      Temp src      SpO2      Weight      Height      Head Circumference      Peak Flow      Pain Score      Pain Loc      Pain Education      Exclude from Growth Chart    No data found.  Updated Vital Signs BP 105/65 (BP Location: Left Arm)   Pulse 70   Temp 98.5 F (36.9 C) (Oral)   Resp 16   LMP 01/24/2023 (Approximate)   SpO2 98%   Physical Exam Vitals and nursing note  reviewed.  Constitutional:      Appearance: Normal appearance. She is normal weight.  HENT:     Head: Normocephalic and atraumatic.     Mouth/Throat:     Mouth: Mucous membranes are moist.     Pharynx: Oropharynx is clear.  Eyes:     Extraocular Movements: Extraocular movements intact.     Conjunctiva/sclera: Conjunctivae normal.     Pupils: Pupils are equal, round, and reactive to light.  Cardiovascular:     Rate and Rhythm: Normal rate and regular rhythm.     Pulses: Normal pulses.     Heart sounds: Normal heart sounds.  Pulmonary:     Effort: Pulmonary effort is normal.     Breath sounds: Normal breath sounds. No wheezing, rhonchi or rales.  Abdominal:     Tenderness: There is no right CVA tenderness or left CVA tenderness.  Musculoskeletal:        General: Normal range of motion.     Cervical back: Normal range of motion and neck supple.  Skin:    General: Skin is warm and dry.  Neurological:     General: No focal deficit present.     Mental Status: She is alert and oriented to person, place, and time. Mental status is at baseline.  Psychiatric:        Mood and Affect: Mood normal.        Behavior: Behavior normal.      UC Treatments / Results  Labs (all labs ordered are listed, but only abnormal results are displayed) Labs Reviewed  POCT URINALYSIS DIP (MANUAL ENTRY) - Abnormal; Notable for the following components:      Result Value   Clarity, UA cloudy (*)    Ketones, POC UA moderate (40) (*)    Spec Grav, UA >=1.030 (*)    Blood, UA moderate (*)    Protein Ur, POC =100 (*)    Nitrite, UA Positive (*)    Leukocytes, UA Trace (*)    All other components within normal limits  URINE CULTURE  POCT URINE PREGNANCY    EKG   Radiology No results found.  Procedures Procedures (including critical care time)  Medications Ordered in UC Medications - No data to display  Initial Impression / Assessment and Plan / UC Course  I have reviewed the triage vital  signs and the nursing notes.  Pertinent labs & imaging results that were available during my care of the patient were reviewed by me and considered in my medical decision making (see chart for details).     MDM: 1.  Acute cystitis with hematuria-Rx'd Bactrim DS 800/160 mg tablet: Take 1 tablet twice daily x 7 days; 2.  Dysuria-UA revealed above, urine culture ordered, Rx'd Bactrim DS 800/160 mg tablet: Take 1 tablet twice daily x 7 days. Instructed patient to take medication as directed with food to completion.  Encouraged to increase daily water intake to 64 ounces per day while taking these medications.  Advised we will follow-up with urine culture results once received.  Advised if symptoms worsen and/or unresolved please follow-up with PCP or here for further evaluation.  Patient discharged home, hemodynamically stable. Final Clinical Impressions(s) / UC Diagnoses   Final diagnoses:  Dysuria  Acute cystitis with hematuria     Discharge Instructions      Instructed patient to take medication as directed with food to completion.  Encouraged to increase daily water intake to 64 ounces per day while taking these medications.  Advised we will follow-up with urine culture results once received.  Advised if symptoms worsen and/or unresolved please follow-up with PCP or here for further evaluation.     ED Prescriptions     Medication Sig Dispense Auth. Provider   sulfamethoxazole-trimethoprim (BACTRIM DS) 800-160 MG tablet Take 1 tablet by mouth 2 (two) times daily for 7 days. 14 tablet Trevor Iha, FNP      PDMP not reviewed this encounter.   Trevor Iha, FNP 02/14/23 [phone]

## 2023-02-17 LAB — URINE CULTURE: Culture: >=100000 — AB

## 2023-03-28 LAB — HM HEPATITIS C SCREENING LAB: HM Hepatitis Screen: NEGATIVE

## 2023-03-28 LAB — HM HIV SCREENING LAB: HM HIV Screening: NEGATIVE

## 2023-04-22 ENCOUNTER — Ambulatory Visit
Admission: EM | Admit: 2023-04-22 | Discharge: 2023-04-22 | Disposition: A | Discharge status: Home / Self Care | Payer: Medicaid Other | Attending: Family Medicine | Admitting: Family Medicine

## 2023-04-22 ENCOUNTER — Ambulatory Visit: Payer: Medicaid Other

## 2023-04-22 DIAGNOSIS — R197 Diarrhea, unspecified: Secondary | ICD-10-CM | POA: Diagnosis not present

## 2023-04-22 DIAGNOSIS — K5909 Other constipation: Secondary | ICD-10-CM | POA: Diagnosis not present

## 2023-04-22 DIAGNOSIS — R1084 Generalized abdominal pain: Secondary | ICD-10-CM

## 2023-04-22 MED ORDER — POLYETHYLENE GLYCOL 3350 17 G PO PACK
17.0000 g | PACK | Freq: Every day | ORAL | 0 refills | Status: DC
Start: 2023-04-22 — End: 2023-05-20

## 2023-04-22 NOTE — ED Triage Notes (Signed)
Pt c/o abd pain and "spasms" x 3 days. Pain is intermittent. Had some constipation and now having diarrhea. Some nausea. Tylenol prn.

## 2023-04-22 NOTE — ED Provider Notes (Signed)
[facility]    CSN: [id number] Arrival date & time: 04/22/23  1740      History   Chief Complaint Chief Complaint  Patient presents with   Abdominal Pain    HPI Renee [name] is a 22 y.o. female.   Patient's had abdominal pain for 2 to 3 days.  She states it started off as crampy abdominal pain in the middle abdomen.  She had some constipation.  Ever since then she has had diarrhea.  She has a decreased appetite.  She is trying to drink enough fluids.  She has not taken any medication.  Does not usually have trouble with constipation or diarrhea.  No fever or chills.  No blood or mucus in the stool.  No urinary complaints.  Normal menstrual period    History reviewed. No pertinent past medical history.  There are no active problems to display for this patient.   History reviewed. No pertinent surgical history.  OB History   No obstetric history on file.      Home Medications    Prior to Admission medications   Medication Sig Start Date End Date Taking? Authorizing Provider  polyethylene glycol (MIRALAX / GLYCOLAX) 17 g packet Take 17 g by mouth daily. 04/22/23  Yes Eustace Moore, MD    Family History History reviewed. No pertinent family history.  Social History Social History   Tobacco Use   Smoking status: Never   Smokeless tobacco: Never  Vaping Use   Vaping status: Every Day   Substances: Nicotine  Substance Use Topics   Alcohol use: Yes    Comment: social   Drug use: Not Currently    Types: Marijuana     Allergies   Patient has no known allergies.   Review of Systems Review of Systems See HPI  Physical Exam Triage Vital Signs ED Triage Vitals [04/22/23 1838]  Encounter Vitals Group     BP 106/69     Systolic BP Percentile      Diastolic BP Percentile      Pulse Rate (!) 108     Resp 17     Temp 98.3 F (36.8 C)     Temp Source Oral     SpO2 100 %     Weight      Height      Head Circumference      Peak  Flow      Pain Score 0     Pain Loc      Pain Education      Exclude from Growth Chart    No data found.  Updated Vital Signs BP 106/69 (BP Location: Left Arm)   Pulse (!) 108   Temp 98.3 F (36.8 C) (Oral)   Resp 17   LMP 03/29/2023 (Approximate)   SpO2 100%      Physical Exam Constitutional:      General: She is not in acute distress.    Appearance: She is well-developed and normal weight. She is not ill-appearing.  HENT:     Head: Normocephalic and atraumatic.  Eyes:     Conjunctiva/sclera: Conjunctivae normal.     Pupils: Pupils are equal, round, and reactive to light.  Cardiovascular:     Rate and Rhythm: Normal rate.  Pulmonary:     Effort: Pulmonary effort is normal. No respiratory distress.  Abdominal:     General: Abdomen is flat and scaphoid. Bowel sounds are normal. There is no distension.     Palpations:  Abdomen is soft. There is no hepatomegaly or splenomegaly.     Tenderness: There is abdominal tenderness in the right lower quadrant. There is no guarding or rebound.  Musculoskeletal:        General: Normal range of motion.     Cervical back: Normal range of motion.  Skin:    General: Skin is warm and dry.  Neurological:     Mental Status: She is alert.      UC Treatments / Results  Labs (all labs ordered are listed, but only abnormal results are displayed) Labs Reviewed - No data to display  EKG   Radiology DG Abdomen 1 View Result Date: 04/22/2023 CLINICAL DATA:  Lower abdominal pain with diarrhea for several days, initial encounter EXAM: ABDOMEN - 1 VIEW COMPARISON:  None Available. FINDINGS: Scattered large and small bowel gas is noted. Mild retained fecal material is noted without obstructive change. No free air is seen. No abnormal mass is seen. Calcification is noted to the right of the midline in the pelvis which could represent a distal ureteral stone. Correlate with clinical history. IMPRESSION: Mild retained fecal material without  obstructive change. Calcification in the right pelvis which could represent a distal ureteral stone. CT of the abdomen and pelvis may be helpful for further evaluation. Electronically Signed   By: Alcide Clever M.D.   On: 04/22/2023 20:45    Procedures Procedures (including critical care time)  Medications Ordered in UC Medications - No data to display  Initial Impression / Assessment and Plan / UC Course  I have reviewed the triage vital signs and the nursing notes.  Pertinent labs & imaging results that were available during my care of the patient were reviewed by me and considered in my medical decision making (see chart for details).     X-rays noted.  Discussed with patient.  Likelihood of ureteral stone given patient's history and physical examination unlikely Final Clinical Impressions(s) / UC Diagnoses   Final diagnoses:  Generalized abdominal pain  Chronic constipation with overflow     Discharge Instructions      The main problem in your stomach is constipation.  You have diarrhea because only the loose stool is moving through your colon.  You need to treat this with MiraLAX twice a day until your bowels empty.  Then use MiraLAX daily to prevent recurrence of constipation  Drink lots of water     ED Prescriptions     Medication Sig Dispense Auth. Provider   polyethylene glycol (MIRALAX / GLYCOLAX) 17 g packet Take 17 g by mouth daily. 14 each Eustace Moore, MD      PDMP not reviewed this encounter.   Eustace Moore, MD 04/23/23 [phone]

## 2023-04-22 NOTE — Discharge Instructions (Addendum)
The main problem in your stomach is constipation.  You have diarrhea because only the loose stool is moving through your colon.  You need to treat this with MiraLAX twice a day until your bowels empty.  Then use MiraLAX daily to prevent recurrence of constipation  Drink lots of water

## 2023-05-13 LAB — OB RESULTS CONSOLE GC/CHLAMYDIA: Chlamydia: NEGATIVE

## 2023-05-14 ENCOUNTER — Ambulatory Visit
Admission: EM | Admit: 2023-05-14 | Discharge: 2023-05-14 | Disposition: A | Discharge status: Home / Self Care | Payer: Medicaid Other

## 2023-05-14 ENCOUNTER — Other Ambulatory Visit: Payer: Self-pay

## 2023-05-14 ENCOUNTER — Encounter: Payer: Self-pay | Admitting: Emergency Medicine

## 2023-05-14 DIAGNOSIS — K461 Unspecified abdominal hernia with gangrene: Secondary | ICD-10-CM | POA: Diagnosis not present

## 2023-05-14 NOTE — ED Provider Notes (Signed)
[name]    CSN: [id number] Arrival date & time: 05/14/23  1901      History   Chief Complaint Chief Complaint  Patient presents with   Hernia    HPI Renee [name] is a 23 y.o. female.   HPI 23 year old female presents with possible abdominal hernia and request second opinion from previous opinion given by another urgent care.  History reviewed. No pertinent past medical history.  There are no active problems to display for this patient.   History reviewed. No pertinent surgical history.  OB History   No obstetric history on file.      Home Medications    Prior to Admission medications   Medication Sig Start Date End Date Taking? Authorizing Provider  polyethylene glycol (MIRALAX / GLYCOLAX) 17 g packet Take 17 g by mouth daily. 04/22/23   Eustace Moore, MD    Family History Family History  Problem Relation Age of Onset   Healthy Mother    Healthy Father     Social History Social History   Tobacco Use   Smoking status: Never   Smokeless tobacco: Never  Vaping Use   Vaping status: Every Day   Substances: Nicotine  Substance Use Topics   Alcohol use: Yes    Comment: social   Drug use: Not Currently    Types: Marijuana     Allergies   Patient has no known allergies.   Review of Systems Review of Systems   Physical Exam Triage Vital Signs ED Triage Vitals  Encounter Vitals Group     BP      Systolic BP Percentile      Diastolic BP Percentile      Pulse      Resp      Temp      Temp src      SpO2      Weight      Height      Head Circumference      Peak Flow      Pain Score      Pain Loc      Pain Education      Exclude from Growth Chart    No data found.  Updated Vital Signs BP 116/80 (BP Location: Left Arm)   Pulse 92   Temp 98.5 F (36.9 C) (Oral)   Ht 5\' 2"  (1.575 m)   Wt 120 lb (54.4 kg)   LMP 04/29/2023 (Approximate)   SpO2 97%   BMI 21.95 kg/m   Visual Acuity Right Eye Distance:    Left Eye Distance:   Bilateral Distance:    Right Eye Near:   Left Eye Near:    Bilateral Near:     Physical Exam Vitals and nursing note reviewed.  Constitutional:      General: She is not in acute distress.    Appearance: Normal appearance. She is normal weight. She is not ill-appearing.  HENT:     Head: Normocephalic and atraumatic.     Mouth/Throat:     Mouth: Mucous membranes are moist.     Pharynx: Oropharynx is clear.  Eyes:     Extraocular Movements: Extraocular movements intact.     Conjunctiva/sclera: Conjunctivae normal.     Pupils: Pupils are equal, round, and reactive to light.  Cardiovascular:     Rate and Rhythm: Normal rate and regular rhythm.     Pulses: Normal pulses.     Heart sounds: Normal heart sounds.  Pulmonary:  Effort: Pulmonary effort is normal.     Breath sounds: Normal breath sounds. No wheezing, rhonchi or rales.  Abdominal:     General: There is no distension.     Palpations: There is no mass.     Tenderness: There is no abdominal tenderness. There is no right CVA tenderness, left CVA tenderness, guarding or rebound.     Hernia: A hernia is present.     Comments: Mild central hernia noted over superior upper abdomen, non-TTP  Musculoskeletal:        General: Normal range of motion.     Cervical back: Normal range of motion and neck supple.  Skin:    General: Skin is warm and dry.  Neurological:     General: No focal deficit present.     Mental Status: She is alert and oriented to person, place, and time. Mental status is at baseline.  Psychiatric:        Mood and Affect: Mood normal.        Behavior: Behavior normal.      UC Treatments / Results  Labs (all labs ordered are listed, but only abnormal results are displayed) Labs Reviewed - No data to display  EKG   Radiology No results found.  Procedures Procedures (including critical care time)  Medications Ordered in UC Medications - No data to display  Initial  Impression / Assessment and Plan / UC Course  I have reviewed the triage vital signs and the nursing notes.  Pertinent labs & imaging results that were available during my care of the patient were reviewed by me and considered in my medical decision making (see chart for details).     MDM: 1.  Recurrent abdominal hernia without gangrene, unspecified hernia type-Patient to follow-up with PCP for further evaluation of abdominal hernia.  [place] primary health care provider information provided with this AVS this evening.  Final Clinical Impressions(s) / UC Diagnoses   Final diagnoses:  Recurrent abdominal hernia with gangrene, unspecified hernia type     Discharge Instructions      Patient to follow-up with PCP for further evaluation of abdominal hernia.  [place] primary health care provider information provided with this AVS this evening.     ED Prescriptions   None    PDMP not reviewed this encounter.   Trevor Iha, FNP 05/14/23 2014

## 2023-05-14 NOTE — ED Triage Notes (Signed)
Patient states that she was seen at another UC on yesterday and diagnosed with an abdominal hernia.  Patient was instructed to get a 2nd opinion before receiving further treatment.

## 2023-05-14 NOTE — Discharge Instructions (Addendum)
Patient to follow-up with PCP for further evaluation of abdominal hernia.  [place] primary health care provider information provided with this AVS this evening.

## 2023-05-20 ENCOUNTER — Encounter: Payer: Self-pay | Admitting: Urgent Care

## 2023-05-20 ENCOUNTER — Ambulatory Visit: Payer: Medicaid Other | Admitting: Urgent Care

## 2023-05-20 ENCOUNTER — Other Ambulatory Visit: Payer: Self-pay | Admitting: Urgent Care

## 2023-05-20 VITALS — BP 108/73 | HR 84 | Ht 61.0 in | Wt 123.8 lb

## 2023-05-20 DIAGNOSIS — N979 Female infertility, unspecified: Secondary | ICD-10-CM | POA: Insufficient documentation

## 2023-05-20 DIAGNOSIS — M6208 Separation of muscle (nontraumatic), other site: Secondary | ICD-10-CM | POA: Insufficient documentation

## 2023-05-20 DIAGNOSIS — K5900 Constipation, unspecified: Secondary | ICD-10-CM

## 2023-05-20 DIAGNOSIS — R7989 Other specified abnormal findings of blood chemistry: Secondary | ICD-10-CM | POA: Insufficient documentation

## 2023-05-20 LAB — COMPREHENSIVE METABOLIC PANEL
ALT: 12 U/L (ref 0–35)
AST: 18 U/L (ref 0–37)
Albumin: 4.7 g/dL (ref 3.5–5.2)
Alkaline Phosphatase: 60 U/L (ref 39–117)
BUN: 9 mg/dL (ref 6–23)
CO2: 29 meq/L (ref 19–32)
Calcium: 9.1 mg/dL (ref 8.4–10.5)
Chloride: 103 meq/L (ref 96–112)
Creatinine, Ser: 0.64 mg/dL (ref 0.40–1.20)
GFR: 124.9 mL/min (ref >60.00)
Glucose, Bld: 80 mg/dL (ref 70–99)
Potassium: 4.2 meq/L (ref 3.5–5.1)
Sodium: 138 meq/L (ref 135–145)
Total Bilirubin: 0.3 mg/dL (ref 0.2–1.2)
Total Protein: 7.3 g/dL (ref 6.0–8.3)

## 2023-05-20 LAB — CBC WITH DIFFERENTIAL/PLATELET
Basophils Absolute: 0.1 10*3/uL (ref 0.0–0.1)
Basophils Relative: 0.8 % (ref 0.0–3.0)
Eosinophils Absolute: 0.1 10*3/uL (ref 0.0–0.7)
Eosinophils Relative: 1.2 % (ref 0.0–5.0)
HCT: 41.6 % (ref 36.0–46.0)
Hemoglobin: 13.9 g/dL (ref 12.0–15.0)
Lymphocytes Relative: 28.5 % (ref 12.0–46.0)
Lymphs Abs: 2.1 10*3/uL (ref 0.7–4.0)
MCHC: 33.4 g/dL (ref 30.0–36.0)
MCV: 89 fL (ref 78.0–100.0)
Monocytes Absolute: 0.6 10*3/uL (ref 0.1–1.0)
Monocytes Relative: 8.1 % (ref 3.0–12.0)
Neutro Abs: 4.6 10*3/uL (ref 1.4–7.7)
Neutrophils Relative %: 61.4 % (ref 43.0–77.0)
Platelets: 222 10*3/uL (ref 150.0–400.0)
RBC: 4.68 Mil/uL (ref 3.87–5.11)
RDW: 13.2 % (ref 11.5–15.5)
WBC: 7.4 10*3/uL (ref 4.0–10.5)

## 2023-05-20 LAB — LUTEINIZING HORMONE: LH: 9.83 m[IU]/mL

## 2023-05-20 LAB — LIPID PANEL
Cholesterol: 125 mg/dL (ref 0–200)
HDL: 64.4 mg/dL (ref >39.00)
LDL Cholesterol: 52 mg/dL (ref 0–99)
NonHDL: 60.96
Total CHOL/HDL Ratio: 2
Triglycerides: 43 mg/dL (ref 0.0–149.0)
VLDL: 8.6 mg/dL (ref 0.0–40.0)

## 2023-05-20 LAB — HEMOGLOBIN A1C: Hgb A1c MFr Bld: 5.3 % (ref 4.6–6.5)

## 2023-05-20 LAB — TSH: TSH: 0.82 u[IU]/mL (ref 0.35–5.50)

## 2023-05-20 LAB — TESTOSTERONE: Testosterone: 55.76 ng/dL — ABNORMAL HIGH (ref 15.00–40.00)

## 2023-05-20 LAB — FOLLICLE STIMULATING HORMONE: FSH: 3.5 m[IU]/mL

## 2023-05-20 NOTE — Patient Instructions (Addendum)
You have a diastasis recti. This is not concerning unless it is causing symptoms. Please see the attached handout.  Regarding your bowels - please try to drink 3 liters of water daily. Increasing fiber will also help your bowels move more regularly. You can take 17g (one scoop or capful) of miralax daily to help with regulation   We drew labs today to assess for infertility. Please call the [facility] to schedule your hysterogram. [facility] Address: [address], [place], [place] [address] Phone: [phone]   Return for recheck in 3-4 weeks. We will update your pap smear at that time.

## 2023-05-20 NOTE — Assessment & Plan Note (Signed)
Diastasis Recti Abdominal bulge noted, likely due to diastasis recti. No pain or discomfort reported. Discussed that surgical intervention is typically not indicated unless there is severe pain or cosmetic concern. -Provided patient with home physical therapy exercises to help manage the condition.

## 2023-05-20 NOTE — Progress Notes (Signed)
New Patient Office Visit  Subjective:  Patient ID: Renee [name], female    DOB: [birth year]  Age: 23 y.o. MRN: [id number]  CC:  Chief Complaint  Patient presents with   Establish Care    New pt est care. She has a hernia and wanted to see if she can get a referral for 2nd opinion.   Discussed the use of AI software for clinical note transcription with the patient who gave verbal consent to proceed.   HPI Renee [name] presents to establish care.   History of Present Illness The patient, seeking a new primary care provider, presents with a chief complaint of an abdominal hernia, as diagnosed by two previous physicians. The patient was not initially aware of the hernia, only seeking medical attention after it was pointed out during unrelated visits. The hernia is located in the midline of the abdomen, presenting as a noticeable bulge. The patient is unsure of the duration of the hernia's presence, as it was only noticed after being pointed out at an urgent care.  The hernia does not cause the patient any pain or discomfort. However, the patient has a history of abdominal issues, previously attributed to ovarian cysts. The patient has no children and has not experienced any nausea, vomiting, or dizziness since the hernia was identified.  In addition to the hernia, the patient has been experiencing issues with fertility. The patient and her partner have been trying to conceive for approximately two years without success. The patient has had some fertility testing in the past, but was told she would need to see a specialist for further evaluation. The patient's menstrual periods are regular, and she is not currently on any form of birth control.  The patient has recently been prescribed antibiotics for an unrelated issue and is not currently on any other medications. The patient has no history of pelvic inflammatory disease or any other chronic pelvic inflammation. 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 She does not engage in heavy lifting or strenuous physical activity at work.  Pt does admit to constipation, typically producing a stool every three days. She admits to straining. Was previously prescribed miralax but does not take routinely. Only drinks roughly 32 ounces of water a day.   Outpatient Encounter Medications as of 05/20/2023  Medication Sig   doxycycline (VIBRAMYCIN) 100 MG capsule Take 100 mg by mouth 2 (two) times daily.   metroNIDAZOLE (FLAGYL) 500 MG tablet Take 500 mg by mouth 2 (two) times daily.   [DISCONTINUED] polyethylene glycol (MIRALAX / GLYCOLAX) 17 g packet Take 17 g by mouth daily. (Patient not taking: Reported on 05/20/2023)   No facility-administered encounter medications on file as of 05/20/2023.    Past Medical History:  Diagnosis Date   Urinary tract infection     History reviewed. No pertinent surgical history.  Family History  Problem Relation Age of Onset   Healthy Mother    Healthy Father     Social History   Socioeconomic History   Marital status: Single    Spouse name: Not on file   Number of children: Not on file   Years of education: Not on file   Highest education level: Not on file  Occupational History   Not on file  Tobacco Use   Smoking status: Never   Smokeless tobacco: Never  Vaping Use   Vaping status: Every Day   Substances: Nicotine  Substance and Sexual Activity   Alcohol use: Yes  Comment: social   Drug use: Not Currently    Types: Marijuana   Sexual activity: Yes    Partners: Male    Birth control/protection: None  Other Topics Concern   Not on file  Social History Narrative   Not on file   Social Drivers of Health   Financial Resource Strain: Not on file  Food Insecurity: Not on file  Transportation Needs: Not on file  Physical Activity: Not on file  Stress: Not on file  Social Connections: Not on file  Intimate Partner Violence: Not on file    ROS: as  noted in HPI  Objective:  BP 108/73   Pulse 84   Ht 5\' 1"  (1.549 m)   Wt 123 lb 12.8 oz (56.2 kg)   LMP 04/29/2023 (Approximate)   SpO2 100%   BMI 23.39 kg/m   Physical Exam Vitals and nursing note reviewed. Exam conducted with a chaperone present.  Constitutional:      General: She is not in acute distress.    Appearance: Normal appearance. She is not ill-appearing, toxic-appearing or diaphoretic.  HENT:     Head: Normocephalic and atraumatic.     Right Ear: Tympanic membrane, ear canal and external ear normal. There is no impacted cerumen.     Left Ear: Tympanic membrane, ear canal and external ear normal. There is no impacted cerumen.     Nose: Nose normal.     Mouth/Throat:     Mouth: Mucous membranes are moist.     Pharynx: Oropharynx is clear. No oropharyngeal exudate or posterior oropharyngeal erythema.  Eyes:     General: No scleral icterus.       Right eye: No discharge.        Left eye: No discharge.     Extraocular Movements: Extraocular movements intact.     Pupils: Pupils are equal, round, and reactive to light.  Neck:     Thyroid: No thyroid mass, thyromegaly or thyroid tenderness.  Cardiovascular:     Rate and Rhythm: Normal rate and regular rhythm.     Pulses: Normal pulses.     Heart sounds: No murmur heard. Pulmonary:     Effort: Pulmonary effort is normal. No respiratory distress.     Breath sounds: Normal breath sounds. No stridor. No wheezing or rhonchi.  Abdominal:     General: Abdomen is flat. Bowel sounds are normal. There is no distension.     Palpations: Abdomen is soft. There is no mass.     Tenderness: There is no abdominal tenderness. There is no guarding.     Comments: Diastasis recti noted to midline abdomen  Musculoskeletal:     Cervical back: Normal range of motion and neck supple. No rigidity or tenderness.     Right lower leg: No edema.     Left lower leg: No edema.  Lymphadenopathy:     Cervical: No cervical adenopathy.  Skin:     General: Skin is warm and dry.     Coloration: Skin is not jaundiced.     Findings: No bruising, erythema or rash.  Neurological:     General: No focal deficit present.     Mental Status: She is alert and oriented to person, place, and time.     Sensory: No sensory deficit.     Motor: No weakness.  Psychiatric:        Mood and Affect: Mood normal.        Behavior: Behavior normal.  Assessment & Plan:  Infertility, female Assessment & Plan: Infertility Patient has been trying to conceive for two years without success. No known history of thyroid disorders or polycystic ovarian syndrome. Menstrual periods are regular. -Initiate infertility workup, including blood work and a hysterosonogram. -Advise partner to undergo sperm count testing.  Orders: -     CBC with Differential/Platelet -     Hemoglobin A1c -     TSH -     Lipid panel -     Comprehensive metabolic panel -     Testosterone -     Iron, TIBC and Ferritin Panel -     Luteinizing hormone -     Estradiol -     Follicle stimulating hormone -     Progesterone -     DG Hysterogram (HSD); Future  Diastasis recti Assessment & Plan: Diastasis Recti Abdominal bulge noted, likely due to diastasis recti. No pain or discomfort reported. Discussed that surgical intervention is typically not indicated unless there is severe pain or cosmetic concern. -Provided patient with home physical therapy exercises to help manage the condition.   Constipation, unspecified constipation type Assessment & Plan: Constipation Bowel movements occur approximately once every three days, sometimes with straining. Patient drinks approximately two bottles of water per day and one cup of coffee. -Increase water intake to approximately eight bottles per day. -Continue Miralax as needed for constipation relief.     Return in about 4 weeks (around 06/17/2023).   Maretta Bees, PA

## 2023-05-20 NOTE — Assessment & Plan Note (Signed)
Infertility Patient has been trying to conceive for two years without success. No known history of thyroid disorders or polycystic ovarian syndrome. Menstrual periods are regular. -Initiate infertility workup, including blood work and a hysterosonogram. -Advise partner to undergo sperm count testing.

## 2023-05-20 NOTE — Assessment & Plan Note (Signed)
Constipation Bowel movements occur approximately once every three days, sometimes with straining. Patient drinks approximately two bottles of water per day and one cup of coffee. -Increase water intake to approximately eight bottles per day. -Continue Miralax as needed for constipation relief.

## 2023-05-21 LAB — ESTRADIOL: Estradiol: 241 pg/mL

## 2023-05-21 LAB — IRON,TIBC AND FERRITIN PANEL
%SAT: 17 % (ref 16–45)
Ferritin: 21 ng/mL (ref 16–154)
Iron: 63 ug/dL (ref 40–190)
TIBC: 369 ug/dL (ref 250–450)

## 2023-05-21 LAB — PROGESTERONE: Progesterone: 2.4 ng/mL

## 2023-06-13 ENCOUNTER — Other Ambulatory Visit (HOSPITAL_COMMUNITY)
Admission: RE | Admit: 2023-06-13 | Discharge: 2023-06-13 | Disposition: A | Discharge status: Home / Self Care | Payer: Medicaid Other | Source: Ambulatory Visit | Attending: Urgent Care | Admitting: Urgent Care

## 2023-06-13 ENCOUNTER — Encounter: Payer: Self-pay | Admitting: Urgent Care

## 2023-06-13 ENCOUNTER — Ambulatory Visit: Payer: Medicaid Other | Admitting: Urgent Care

## 2023-06-13 VITALS — BP 118/80 | HR 102 | Temp 97.7°F | Wt 125.0 lb

## 2023-06-13 DIAGNOSIS — Z124 Encounter for screening for malignant neoplasm of cervix: Secondary | ICD-10-CM | POA: Diagnosis present

## 2023-06-13 DIAGNOSIS — N760 Acute vaginitis: Secondary | ICD-10-CM | POA: Diagnosis present

## 2023-06-13 DIAGNOSIS — N750 Cyst of Bartholin's gland: Secondary | ICD-10-CM | POA: Diagnosis not present

## 2023-06-13 MED ORDER — METRONIDAZOLE 500 MG PO TABS: 500.0000 mg | ORAL_TABLET | Freq: Two times a day (BID) | ORAL | 0 refills | Status: AC

## 2023-06-13 MED ORDER — CEFTRIAXONE SODIUM 1 G IJ SOLR
0.5000 g | Freq: Once | INTRAMUSCULAR | Status: AC
  Administered 2023-06-13: 0.5 g via INTRAMUSCULAR

## 2023-06-13 MED ORDER — DOXYCYCLINE HYCLATE 100 MG PO TABS: 100.0000 mg | ORAL_TABLET | Freq: Two times a day (BID) | ORAL | 0 refills | Status: AC

## 2023-06-13 NOTE — Patient Instructions (Signed)
 Please call [facility] to schedule a further evaluation for your bartholins cyst: [address], [place], [place] [address] Phone: [phone]   You were given ceftriaxone today. Please start doxycycline and flagyl, both antibiotics are to be taken twice daily. Avoid excessive dairy and avoid alcohol while on these antibiotics. DO NOT have sexual intercourse until your swab results come back. If positive for any STI, partner will also need to be treated prior to resuming intercourse.

## 2023-06-13 NOTE — Progress Notes (Unsigned)
 Established Patient Office Visit  Subjective:  Patient ID: Renee [name], female    DOB: 05-02-2000  Age: 24 y.o. MRN: [id number]  Chief Complaint  Patient presents with   vaginal cyst    4-5 days; requesting STD testing    HPI  Discussed the use of AI scribe software for clinical note transcription with the patient, who gave verbal consent to proceed.  History of Present Illness   Renee [name] is a 23 year old female who presents with recurrent Bartholin's cysts and vaginal discharge.  She experiences recurrent Bartholin's cysts, with the most recent occurrence at the beginning of January. These cysts appear approximately every other month and typically resolve on their own or with warm compresses. However, the current cyst has become hard and painful. The cysts always appear in the same location on the L labia and have not been associated with any specific known activities or triggers.  In addition to the cyst, she has vaginal discharge for the past three days, which is not accompanied by itching or abnormal odor. No new sexual partners.       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 No pertinent surgical history.    ROS: as noted in HPI  Objective:     BP 118/80   Pulse (!) 102   Temp 97.7 F (36.5 C)   Wt 125 lb (56.7 kg)   LMP 05/31/2023 (Approximate)   SpO2 98%   BMI 23.62 kg/m  BP Readings from Last 3 Encounters:  06/13/23 118/80  05/20/23 108/73  05/14/23 116/80   Wt Readings from Last 3 Encounters:  06/13/23 125 lb (56.7 kg)  05/20/23 123 lb 12.8 oz (56.2 kg)  05/14/23 120 lb (54.4 kg)      Physical Exam Vitals and nursing note reviewed. Exam conducted with a chaperone present.  Constitutional:      General: She is  not in acute distress.    Appearance: Normal appearance. She is normal weight. She is not ill-appearing, toxic-appearing or diaphoretic.  HENT:     Head: Normocephalic and atraumatic.  Eyes:     General: No scleral icterus.       Right eye: No discharge.        Left eye: No discharge.  Cardiovascular:     Rate and Rhythm: Normal rate.  Pulmonary:     Effort: Pulmonary effort is normal. No respiratory distress.  Genitourinary:    Pubic Area: No rash or pubic lice.      Labia:        Right: No rash, tenderness, lesion or injury.        Left: No rash, tenderness or injury.      Urethra: No prolapse, urethral pain, urethral swelling or urethral lesion.     Vagina: Vaginal discharge present. No lesions.     Cervix: Friability and cervical bleeding (after pap collection) present. No cervical motion tenderness, discharge, lesion or eversion.     Uterus: Normal.      Adnexa: Left adnexa normal.       Right: Tenderness (minimal on R) present. No mass or fullness.         Left: No mass, tenderness or fullness.       Rectum: Normal.    Lymphadenopathy:  Lower Body: No right inguinal adenopathy. No left inguinal adenopathy.  Neurological:     Mental Status: She is alert.      No results found for any visits on 06/13/23.  Last CBC Lab Results  Component Value Date   WBC 7.4 05/20/2023   HGB 13.9 05/20/2023   HCT 41.6 05/20/2023   MCV 89.0 05/20/2023   MCH 29.4 09/13/2021   RDW 13.2 05/20/2023   PLT 222.0 05/20/2023   Last metabolic panel Lab Results  Component Value Date   GLUCOSE 80 05/20/2023   NA 138 05/20/2023   K 4.2 05/20/2023   CL 103 05/20/2023   CO2 29 05/20/2023   BUN 9 05/20/2023   CREATININE 0.64 05/20/2023   GFR 124.90 05/20/2023   CALCIUM 9.1 05/20/2023   PROT 7.3 05/20/2023   ALBUMIN 4.7 05/20/2023   BILITOT 0.3 05/20/2023   ALKPHOS 60 05/20/2023   AST 18 05/20/2023   ALT 12 05/20/2023   ANIONGAP 8 09/13/2021      The ASCVD Risk score (Arnett  DK, et al., 2019) failed to calculate for the following reasons:   The 2019 ASCVD risk score is only valid for ages 74 to 68  Assessment & Plan:  Bartholin's cyst -     Ambulatory referral to Gynecology  Cervical cancer screening -     Cytology - PAP  Acute vaginitis -     Cytology - PAP -     cefTRIAXone Sodium -     Doxycycline Hyclate; Take 1 tablet (100 mg total) by mouth 2 (two) times daily for 7 days.  Dispense: 14 tablet; Refill: 0 -     metroNIDAZOLE; Take 1 tablet (500 mg total) by mouth 2 (two) times daily for 7 days.  Dispense: 14 tablet; Refill: 0  Assessment and Plan    Recurrent Bartholin's Cyst Frequent recurrence, approximately every other month. No identified triggers. Current cyst present since early January, initially self-managed with warm compresses, but now hard and painful. -Consideration for placement of Word catheter to promote continuous drainage and prevent recurrence. Referral placed today to gynecology for assessment -rocephin IM today, in combination with flagyl/ doxy  Vaginal Discharge Present for three days, no associated itching or abnormal odor. No new sexual partners. -Performed pelvic exam- discharge concerning for possible STI. Start empiric tx with IM rocephin, PO doxy and flagyl x 1 week. -Include STD testing as part of Pap smear.  Routine Health Maintenance Due for Pap smear. -Performed Pap smear during pelvic exam.         No follow-ups on file.   Maretta Bees, PA

## 2023-06-17 ENCOUNTER — Encounter: Payer: Self-pay | Admitting: Urgent Care

## 2023-06-17 ENCOUNTER — Telehealth: Payer: Self-pay

## 2023-06-17 LAB — CYTOLOGY - PAP
Chlamydia: NEGATIVE
Comment: NEGATIVE
Comment: NEGATIVE
Comment: NEGATIVE
Comment: NORMAL
Diagnosis: NEGATIVE
Diagnosis: REACTIVE
High risk HPV: NEGATIVE
Neisseria Gonorrhea: NEGATIVE
Trichomonas: NEGATIVE

## 2023-06-17 NOTE — Telephone Encounter (Signed)
 Pt called back and is asking about the PCOS   Copied from CRM [id number]. Topic: Clinical - Lab/Test Results >> Jun 17, 2023  9:45 AM Orinda Kenner C wrote: Reason for CRM: Patient is returning the office call, unsure of the reason. Informed patient, lab result 06/13/23. Patient asked about the PCOS in her Estradiol results. Please advise and call back [phone].

## 2023-08-22 ENCOUNTER — Ambulatory Visit: Payer: Self-pay

## 2023-08-22 NOTE — Telephone Encounter (Signed)
 Spoke with pt and she will try to go to the [facility] recommended by agent

## 2023-08-22 NOTE — Telephone Encounter (Signed)
  Chief Complaint: painful cyst lower left vulvar area Symptoms: yellow discharge Frequency: 1 week Pertinent Negatives: Patient denies fever Disposition: [] ED /[] Urgent Care (no appt availability in office) / [] Appointment(In office/virtual)/ []  [place] [facility]/ [] Home Care/ [] Refused Recommended Disposition /[x] [place] [facility]/ []  Follow-up with PCP Additional Notes: Pt wanting to see provider today since pt is going out of town tomorrow. No openings in office  Copied from CRM [id number]. Topic: Clinical - Red Word Triage >> Aug 22, 2023 10:46 AM Renee [name] wrote: Red Word that prompted transfer to Nurse Triage: Painful vaginal cyst.  Patient was seen at Urgent Care 3-4 days ago for this and was informed it may be a boil or a cyst. Painful symptoms are worse and not getting better. Was not able to get a soon enough appt at [facility] and PCP does not see patients for cysts in office. Called CAL line and spoke to [facility] and stated that she can go to another office. Reason for Disposition  SEVERE pain (e.g., excruciating)  Answer Assessment - Initial Assessment Questions 1. APPEARANCE of SWELLING: "What does it look like?"     Cannot see it  2. SIZE: "How large is the swelling?" (e.g., inches, cm; or compare to size of pinhead, tip of pen, eraser, coin, pea, grape, ping pong ball)      Lower left vulva 3. LOCATION: "Where is the swelling located?"     Lower left vulvar area 4. ONSET: "When did the swelling start?"     1 week ago  5. COLOR: "What color is it?" "Is there more than one color?"     Red  6. PAIN: "Is there any pain?" If Yes, ask: "How bad is the pain?" (e.g., scale 1-10; or mild, moderate, severe)     - NONE (0): no pain   - MILD (1-3): doesn't interfere with normal activities    - MODERATE (4-7): interferes with normal activities or awakens from sleep    - SEVERE (8-10): excruciating pain, unable to do any normal activities     severe 7. ITCH: "Does it itch?" If Yes,  ask: "How bad is the itch?"      Yes  8. CAUSE: "What do you think caused the swelling?" 9 OTHER SYMPTOMS: "Do you have any other symptoms?" (e.g., fever)     Yellow discharge  Protocols used: Skin Lump or Localized Swelling-A-AH

## 2023-09-04 ENCOUNTER — Ambulatory Visit: Payer: Self-pay

## 2023-09-04 NOTE — Telephone Encounter (Signed)
 Called to schedule pt. Pt was not able to schedule at the time of call due to being at work. Pt stated she would call back to schedule appt.

## 2023-09-04 NOTE — Telephone Encounter (Signed)
  Chief Complaint: Vaginal Symptoms Symptoms: itching, discharge (white) Frequency: 1-2 days Pertinent Negatives: Patient denies fever, nausea, vomiting, pain Disposition: [] ED /[] Urgent Care (no appt availability in office) / [] Appointment(In office/virtual)/ []  [place] [facility]/ [] Home Care/ [] Refused Recommended Disposition /[] [facility]/ []  Follow-up with PCP Additional Notes: Patient advised that she was having symptoms of bacterial vaginosis for just 1-2 days.  Patient states that she has had this before. Her symptoms include some itching and white discharge.  Patient is not concerned about any sexually transmitted infections.  She denies any fever, nausea, vomiting, or any pain. She was wondering if she could get a prescription sent in to her pharmacy without an appointment because she states she cannot come in for an appointment due to work. Patient is given Care Advice as per protocol and patient is advised that providers usually want to assess patients prior to prescribing medications.  She is also advised that if her provider requires an appointment, someone will contact her and she can call back at any time. Patient is also advised that if anything gets worse to go to the Emergency Room. Patient verbalized understanding.  Reason for Disposition  All other vaginal symptoms  (Exception: Feels like prior yeast infection, minor abrasion, mild rash < 24 hour duration, mild itching.)  Protocols used: Vaginal Symptoms-A-AH

## 2023-09-04 NOTE — Telephone Encounter (Signed)
 1st attempt, mailbox full, unable to LVM  Copied from CRM [id number]. Topic: Clinical - Medication Question >> Sep 04, 2023  9:52 AM Alyse July wrote: Reason for CRM: Patient would like to know if a medication Metronidazole  vaginal gel can be sent to her pharmacy. Patient unable to schedule an appointment due to work schedule conflict. Please contact patient to advise.

## 2023-09-20 ENCOUNTER — Ambulatory Visit: Admission: EM | Admit: 2023-09-20 | Discharge: 2023-09-20 | Disposition: A | Discharge status: Home / Self Care

## 2023-09-20 DIAGNOSIS — R102 Pelvic and perineal pain: Secondary | ICD-10-CM | POA: Diagnosis present

## 2023-09-20 DIAGNOSIS — N898 Other specified noninflammatory disorders of vagina: Secondary | ICD-10-CM | POA: Insufficient documentation

## 2023-09-20 DIAGNOSIS — N750 Cyst of Bartholin's gland: Secondary | ICD-10-CM | POA: Diagnosis not present

## 2023-09-20 MED ORDER — DOXYCYCLINE HYCLATE 100 MG PO CAPS: 100.0000 mg | ORAL_CAPSULE | Freq: Two times a day (BID) | ORAL | 0 refills | Status: AC

## 2023-09-20 MED ORDER — METRONIDAZOLE 500 MG PO TABS: 500.0000 mg | ORAL_TABLET | Freq: Two times a day (BID) | ORAL | 0 refills | Status: DC

## 2023-09-20 MED ORDER — CEFTRIAXONE SODIUM 500 MG IJ SOLR
500.0000 mg | Freq: Once | INTRAMUSCULAR | Status: AC
  Administered 2023-09-20: 500 mg via INTRAMUSCULAR

## 2023-09-20 NOTE — ED Provider Notes (Signed)
 [facility]    CSN: [id number] Arrival date & time: 09/20/23  1656      History   Chief Complaint Chief Complaint  Patient presents with   Vaginal Discomfort    HPI Renee [name] is a 23 y.o. female.   HPI 23 year old female presents with vaginal pain x 5 days.  Patient believes vaginal pain is secondary to possible cyst.  Patient reports was treated for furuncle of labia majora on 08/16/2023 was prescribed doxycycline  which she never took and began taking 4 days ago.  Patient was treated here on 06/13/2023 for Bartholin cyst please see epic for that encounter note.  PMH significant for Bartholin cyst.  Past Medical History:  Diagnosis Date   Urinary tract infection     Patient Active Problem List   Diagnosis Date Noted   High serum testosterone  05/20/2023   Infertility, female 05/20/2023   Diastasis recti 05/20/2023   Constipation 05/20/2023    History reviewed. No pertinent surgical history.  OB History   No obstetric history on file.      Home Medications    Prior to Admission medications   Medication Sig Start Date End Date Taking? Authorizing Provider  doxycycline  (VIBRAMYCIN ) 100 MG capsule Take 1 capsule (100 mg total) by mouth 2 (two) times daily for 7 days. 09/20/23 09/27/23 Yes Leonides Ramp, FNP  metroNIDAZOLE  (FLAGYL ) 500 MG tablet Take 1 tablet (500 mg total) by mouth 2 (two) times daily. 09/20/23  Yes Leonides Ramp, FNP    Family History Family History  Problem Relation Age of Onset   Healthy Mother    Healthy Father     Social History Social History   Tobacco Use   Smoking status: Never   Smokeless tobacco: Never  Vaping Use   Vaping status: Every Day   Substances: Nicotine  Substance Use Topics   Alcohol use: Yes    Comment: social   Drug use: Not Currently    Types: Marijuana     Allergies   Patient has no known allergies.   Review of Systems Review of Systems  Genitourinary:  Positive for pelvic pain and  vaginal pain.     Physical Exam Triage Vital Signs ED Triage Vitals  Encounter Vitals Group     BP      Systolic BP Percentile      Diastolic BP Percentile      Pulse      Resp      Temp      Temp src      SpO2      Weight      Height      Head Circumference      Peak Flow      Pain Score      Pain Loc      Pain Education      Exclude from Growth Chart    No data found.  Updated Vital Signs BP 110/66 (BP Location: Right Arm)   Pulse 87   Temp 98.7 F (37.1 C) (Oral)   Resp 17   LMP 07/24/2023 (Approximate)   SpO2 100%    Physical Exam Vitals and nursing note reviewed. Exam conducted with a chaperone present.  Constitutional:      Appearance: Normal appearance. She is normal weight.  HENT:     Head: Normocephalic and atraumatic.     Mouth/Throat:     Mouth: Mucous membranes are moist.     Pharynx: Oropharynx is clear.  Eyes:  Extraocular Movements: Extraocular movements intact.     Conjunctiva/sclera: Conjunctivae normal.     Pupils: Pupils are equal, round, and reactive to light.  Cardiovascular:     Rate and Rhythm: Normal rate and regular rhythm.     Pulses: Normal pulses.     Heart sounds: Normal heart sounds.  Pulmonary:     Effort: Pulmonary effort is normal.     Breath sounds: Normal breath sounds. No wheezing, rhonchi or rales.  Genitourinary:      Comments: ~4.0 cm x 2.0 cm ovoid shaped erythematous, inflamed, Bartholin cyst noted Musculoskeletal:        General: Normal range of motion.     Cervical back: Normal range of motion and neck supple.  Skin:    General: Skin is warm and dry.  Neurological:     General: No focal deficit present.     Mental Status: She is alert and oriented to person, place, and time. Mental status is at baseline.  Psychiatric:        Mood and Affect: Mood normal.        Behavior: Behavior normal.      UC Treatments / Results  Labs (all labs ordered are listed, but only abnormal results are  displayed) Labs Reviewed  CERVICOVAGINAL ANCILLARY ONLY    EKG   Radiology No results found.  Procedures Procedures (including critical care time)  Medications Ordered in UC Medications  cefTRIAXone  (ROCEPHIN ) injection 500 mg (500 mg Intramuscular Given 09/20/23 1806)    Initial Impression / Assessment and Plan / UC Course  I have reviewed the triage vital signs and the nursing notes.  Pertinent labs & imaging results that were available during my care of the patient were reviewed by me and considered in my medical decision making (see chart for details).     MDM: 1.  Cyst of left Bartholomew gland-Rx'd doxycycline : Take 1 capsule twice daily x 7 days; 2.  Vaginal discharge-swab ordered, Rx'd Flagyl  500 mg tablet: Take 1 tablet twice daily 7 days, IM Rocephin  500 mg given once in clinic and prior to discharge; 3.  Vaginal pain-patient advised of significantly large cyst of left Bartholoin gland and will require general surgery for removal.  Contact information provided to patient. Advised patient to take medications as directed with food to completion.  Encouraged to increase daily water intake to 64 ounces per day while taking this medication.  Advised patient to contact [facility] to schedule appointment for Bartholin cyst removal.  Contact information provided with his AVS today.  Patient discharged home, hemodynamically stable. Final Clinical Impressions(s) / UC Diagnoses   Final diagnoses:  Cyst of left Bartholin gland  Vaginal pain  Vaginal discharge     Discharge Instructions      Advised patient to take medications as directed with food to completion.  Encouraged to increase daily water intake to 64 ounces per day while taking this medication.  Advised patient to contact [facility] to schedule appointment for Bartholin cyst removal.  Contact information provided with his AVS today.   ED Prescriptions     Medication Sig Dispense Auth.  Provider   doxycycline  (VIBRAMYCIN ) 100 MG capsule Take 1 capsule (100 mg total) by mouth 2 (two) times daily for 7 days. 14 capsule Marlicia Sroka, FNP   metroNIDAZOLE  (FLAGYL ) 500 MG tablet Take 1 tablet (500 mg total) by mouth 2 (two) times daily. 14 tablet Tametha Banning, FNP      PDMP not reviewed this  encounter.   Leonides Ramp, FNP 09/20/23 [phone]

## 2023-09-20 NOTE — ED Triage Notes (Addendum)
 Pt c/o vaginal discomfort since Monday. Possible cyst. Some drainage. Was seen at [facility] last month and rx'd doxycycline  which she never took bc sxs went away shortly after. When sxs returned Monday, she started the doxycycline .

## 2023-09-20 NOTE — Discharge Instructions (Addendum)
 Advised patient to take medications as directed with food to completion.  Encouraged to increase daily water intake to 64 ounces per day while taking this medication.  Advised patient to contact [facility] to schedule appointment for Bartholin cyst removal.  Contact information provided with his AVS today.

## 2023-09-23 ENCOUNTER — Telehealth: Payer: Self-pay

## 2023-09-23 DIAGNOSIS — B9689 Other specified bacterial agents as the cause of diseases classified elsewhere: Secondary | ICD-10-CM

## 2023-09-23 LAB — CERVICOVAGINAL ANCILLARY ONLY
Bacterial Vaginitis (gardnerella): POSITIVE — AB
Candida Glabrata: NEGATIVE
Candida Vaginitis: POSITIVE — AB
Chlamydia: NEGATIVE
Comment: NEGATIVE
Comment: NEGATIVE
Comment: NEGATIVE
Comment: NEGATIVE
Comment: NEGATIVE
Comment: NORMAL
Neisseria Gonorrhea: NEGATIVE
Trichomonas: NEGATIVE

## 2023-09-23 MED ORDER — METRONIDAZOLE 0.75 % VA GEL: 1.0000 | Freq: Every day | VAGINAL | 0 refills | Status: AC

## 2023-09-23 NOTE — Telephone Encounter (Signed)
 Pt wanting vaginal inserts of flagyl  instead of pills, [place] NP notified

## 2023-09-23 NOTE — Telephone Encounter (Signed)
 Flagyl  intravaginal gel ordered per patient request.

## 2023-09-24 ENCOUNTER — Ambulatory Visit (HOSPITAL_COMMUNITY): Payer: Self-pay

## 2023-09-30 ENCOUNTER — Other Ambulatory Visit: Payer: Self-pay

## 2023-09-30 ENCOUNTER — Emergency Department (HOSPITAL_BASED_OUTPATIENT_CLINIC_OR_DEPARTMENT_OTHER): Admission: EM | Admit: 2023-09-30 | Discharge: 2023-09-30 | Disposition: A | Discharge status: Home / Self Care

## 2023-09-30 ENCOUNTER — Emergency Department (HOSPITAL_BASED_OUTPATIENT_CLINIC_OR_DEPARTMENT_OTHER)

## 2023-09-30 DIAGNOSIS — Z3A01 Less than 8 weeks gestation of pregnancy: Secondary | ICD-10-CM | POA: Insufficient documentation

## 2023-09-30 DIAGNOSIS — O26891 Other specified pregnancy related conditions, first trimester: Secondary | ICD-10-CM | POA: Diagnosis present

## 2023-09-30 DIAGNOSIS — N949 Unspecified condition associated with female genital organs and menstrual cycle: Secondary | ICD-10-CM

## 2023-09-30 LAB — BASIC METABOLIC PANEL WITH GFR
Anion gap: 11 (ref 5–15)
BUN: 10 mg/dL (ref 6–20)
CO2: 24 mmol/L (ref 22–32)
Calcium: 8.6 mg/dL — ABNORMAL LOW (ref 8.9–10.3)
Chloride: 102 mmol/L (ref 98–111)
Creatinine, Ser: 0.58 mg/dL (ref 0.44–1.00)
GFR, Estimated: >60 mL/min (ref >60)
Glucose, Bld: 84 mg/dL (ref 70–99)
Potassium: 3.5 mmol/L (ref 3.5–5.1)
Sodium: 136 mmol/L (ref 135–145)

## 2023-09-30 LAB — CBC
HCT: 32.8 % — ABNORMAL LOW (ref 36.0–46.0)
Hemoglobin: 11.2 g/dL — ABNORMAL LOW (ref 12.0–15.0)
MCH: 29.2 pg (ref 26.0–34.0)
MCHC: 34.1 g/dL (ref 30.0–36.0)
MCV: 85.6 fL (ref 80.0–100.0)
Platelets: 230 10*3/uL (ref 150–400)
RBC: 3.83 MIL/uL — ABNORMAL LOW (ref 3.87–5.11)
RDW: 13.2 % (ref 11.5–15.5)
WBC: 11.5 10*3/uL — ABNORMAL HIGH (ref 4.0–10.5)
nRBC: 0 % (ref 0.0–0.2)

## 2023-09-30 LAB — URINALYSIS, ROUTINE W REFLEX MICROSCOPIC
Bilirubin Urine: NEGATIVE
Glucose, UA: NEGATIVE mg/dL
Hgb urine dipstick: NEGATIVE
Ketones, ur: NEGATIVE mg/dL
Leukocytes,Ua: NEGATIVE
Nitrite: NEGATIVE
Protein, ur: NEGATIVE mg/dL
Specific Gravity, Urine: >=1.03 (ref 1.005–1.030)
pH: 5.5 (ref 5.0–8.0)

## 2023-09-30 LAB — HCG, QUANTITATIVE, PREGNANCY: hCG, Beta Chain, Quant, S: 122077 m[IU]/mL — ABNORMAL HIGH (ref <5)

## 2023-09-30 LAB — PREGNANCY, URINE: Preg Test, Ur: POSITIVE — AB

## 2023-09-30 NOTE — Discharge Instructions (Signed)
 May take Tylenol  for pain.  Please follow-up with your OB/GYN.  Return if develop fevers, chills, severe abdominal pain, vaginal bleeding greater than 1 pad an hour for 3 hours, lightheadedness, dizziness or any new or worsening symptoms that are concerning to you.

## 2023-09-30 NOTE — ED Triage Notes (Signed)
 Pt states that she has been having some abdominal pain off and on the last few days. Has hx of ovarian cyst. Took a pregnancy test and it was positive.

## 2023-09-30 NOTE — ED Provider Notes (Signed)
 [facility] Provider Note   CSN: [id number] Arrival date & time: 09/30/23  5784     History  Chief Complaint  Patient presents with   Abdominal Pain    Renee [name] is a 23 y.o. female.  23 year old G2, P0 presents to the emergency department with intermittent lower abdominal pain.  Has been having morning sickness for the past several weeks.  Has had some intermittent crampy lower abdominal pain.   no vaginal bleeding.  Reports some scant clear vaginal discharge.  No urinary symptoms.  No nausea vomiting diarrhea.  Last menstrual period mid April   Abdominal Pain      Home Medications Prior to Admission medications   Not on File      Allergies    Patient has no known allergies.    Review of Systems   Review of Systems  Gastrointestinal:  Positive for abdominal pain.    Physical Exam Updated Vital Signs BP (!) 103/54   Pulse 61   Temp 98.3 F (36.8 C)   Resp 18   Ht 5\' 1"  (1.549 m)   Wt 68 kg   LMP 08/01/2023 (Approximate)   SpO2 100%   BMI 28.34 kg/m  Physical Exam Vitals and nursing note reviewed.  Constitutional:      General: She is not in acute distress.    Appearance: She is not toxic-appearing.  HENT:     Head: Normocephalic.  Cardiovascular:     Rate and Rhythm: Normal rate.  Pulmonary:     Effort: Pulmonary effort is normal.     Breath sounds: Normal breath sounds.  Abdominal:     General: Abdomen is flat.     Palpations: Abdomen is soft.     Tenderness: There is no abdominal tenderness.  Skin:    General: Skin is warm and dry.  Neurological:     Mental Status: She is alert.     ED Results / Procedures / Treatments   Labs (all labs ordered are listed, but only abnormal results are displayed) Labs Reviewed  PREGNANCY, URINE - Abnormal; Notable for the following components:      Result Value   Preg Test, Ur POSITIVE (*)    All other components within normal limits  HCG,  QUANTITATIVE, PREGNANCY - Abnormal; Notable for the following components:   hCG, Beta Chain, Quant, S 122,077 (*)    All other components within normal limits  CBC - Abnormal; Notable for the following components:   WBC 11.5 (*)    RBC 3.83 (*)    Hemoglobin 11.2 (*)    HCT 32.8 (*)    All other components within normal limits  BASIC METABOLIC PANEL WITH GFR - Abnormal; Notable for the following components:   Calcium 8.6 (*)    All other components within normal limits  URINALYSIS, ROUTINE W REFLEX MICROSCOPIC    EKG None  Radiology US  OB LESS THAN 14 WEEKS WITH OB TRANSVAGINAL Result Date: 09/30/2023 CLINICAL DATA:  Evaluate pregnancy pelvic pain EXAM: OBSTETRIC <14 WK ULTRASOUND TECHNIQUE: Transabdominal ultrasound was performed for evaluation of the gestation as well as the maternal uterus and adnexal regions. COMPARISON:  None Available. FINDINGS: Intrauterine gestational sac: Single Yolk sac:  Yes, 3.1 mm Embryo:  Yes Cardiac Activity: Yes Heart Rate: 152 bpm MSD:    mm    w     d CRL:   13 mm mm   7 w for d  US  EDC: 05/14/2024 Subchorionic hemorrhage:  None visualized. Maternal uterus/adnexae: Normal IMPRESSION: Normal early intrauterine pregnancy as described Electronically Signed   By: Fredrich Jefferson M.D.   On: 09/30/2023 11:07    Procedures Procedures    Medications Ordered in ED Medications - No data to display  ED Course/ Medical Decision Making/ A&P Clinical Course as of 09/30/23 1128  Mon Sep 30, 2023  1114 FINDINGS: Intrauterine gestational sac: Single  Yolk sac:  Yes, 3.1 mm  Embryo:  Yes  Cardiac Activity: Yes  Heart Rate: 152 bpm  MSD:    mm    w     d  CRL:   13 mm mm   7 w for d                  US  EDC: 05/14/2024  Subchorionic hemorrhage:  None visualized.  Maternal uterus/adnexae: Normal  IMPRESSION: Normal early intrauterine pregnancy as described   [TY]  1128 Labs reassuring; minor anemia compared to prior.  Basic metabolic  panel without electrolyte abnormalities.  hCG level at 122.  Ultrasound to evaluate for ectopic negative; normal intrauterine pregnancy.  She is asymptomatic currently.  Benign abdominal exam.  Discharged to follow-up with her PCP and OB/GYN. [TY]    Clinical Course User Index [TY] Renee Climes, DO                                 Medical Decision Making 23 year old presenting emergency department for intermittent lower abdominal pain in the setting of positive at-home pregnancy test.  She is afebrile vital signs reassuring.  Not having abdominal pain currently.  Pregnancy urine test is positive.  Urine reassuring.  Will get hCG and basic labs.  Plus or minus ultrasound to evaluate for location based on hCG level.  See ED course for further MDM and disposition.  Amount and/or Complexity of Data Reviewed Labs: ordered. Radiology: ordered.         Final Clinical Impression(s) / ED Diagnoses Final diagnoses:  Round ligament pain  Less than [redacted] weeks gestation of pregnancy    Rx / DC Orders ED Discharge Orders     None         Renee Climes, DO 09/30/23 1129

## 2023-10-01 NOTE — Transitions of Care (Post Inpatient/ED Visit) (Signed)
   10/01/2023  Name: Renee [name] MRN: [id number] DOB: 2000-09-29  Today's TOC FU Call Status: Today's TOC FU Call Status:: Unsuccessful Call (1st Attempt) Unsuccessful Call (1st Attempt) Date: 10/01/23  Attempted to reach the patient regarding the most recent Inpatient/ED visit.  Follow Up Plan: Additional outreach attempts will be made to reach the patient to complete the Transitions of Care (Post Inpatient/ED visit) call.   [facility] [place] [facility]

## 2023-10-05 ENCOUNTER — Other Ambulatory Visit: Payer: Self-pay

## 2023-10-05 ENCOUNTER — Emergency Department (HOSPITAL_BASED_OUTPATIENT_CLINIC_OR_DEPARTMENT_OTHER)

## 2023-10-05 ENCOUNTER — Encounter (HOSPITAL_BASED_OUTPATIENT_CLINIC_OR_DEPARTMENT_OTHER): Payer: Self-pay | Admitting: Emergency Medicine

## 2023-10-05 ENCOUNTER — Emergency Department (HOSPITAL_BASED_OUTPATIENT_CLINIC_OR_DEPARTMENT_OTHER)
Admission: EM | Admit: 2023-10-05 | Discharge: 2023-10-05 | Disposition: A | Discharge status: Home / Self Care | Attending: Emergency Medicine | Admitting: Emergency Medicine

## 2023-10-05 DIAGNOSIS — O4690 Antepartum hemorrhage, unspecified, unspecified trimester: Secondary | ICD-10-CM

## 2023-10-05 DIAGNOSIS — O208 Other hemorrhage in early pregnancy: Secondary | ICD-10-CM | POA: Diagnosis not present

## 2023-10-05 DIAGNOSIS — Z3A09 9 weeks gestation of pregnancy: Secondary | ICD-10-CM | POA: Insufficient documentation

## 2023-10-05 DIAGNOSIS — O4691 Antepartum hemorrhage, unspecified, first trimester: Secondary | ICD-10-CM | POA: Diagnosis present

## 2023-10-05 DIAGNOSIS — B3731 Acute candidiasis of vulva and vagina: Secondary | ICD-10-CM | POA: Diagnosis not present

## 2023-10-05 LAB — URINALYSIS, ROUTINE W REFLEX MICROSCOPIC
Bilirubin Urine: NEGATIVE
Glucose, UA: NEGATIVE mg/dL
Ketones, ur: NEGATIVE mg/dL
Nitrite: NEGATIVE
Protein, ur: NEGATIVE mg/dL
Specific Gravity, Urine: 1.025 (ref 1.005–1.030)
pH: 6 (ref 5.0–8.0)

## 2023-10-05 LAB — URINALYSIS, MICROSCOPIC (REFLEX)

## 2023-10-05 LAB — CBC
HCT: 33.3 % — ABNORMAL LOW (ref 36.0–46.0)
Hemoglobin: 11.4 g/dL — ABNORMAL LOW (ref 12.0–15.0)
MCH: 29.2 pg (ref 26.0–34.0)
MCHC: 34.2 g/dL (ref 30.0–36.0)
MCV: 85.2 fL (ref 80.0–100.0)
Platelets: 252 10*3/uL (ref 150–400)
RBC: 3.91 MIL/uL (ref 3.87–5.11)
RDW: 13.3 % (ref 11.5–15.5)
WBC: 11.9 10*3/uL — ABNORMAL HIGH (ref 4.0–10.5)
nRBC: 0 % (ref 0.0–0.2)

## 2023-10-05 LAB — BASIC METABOLIC PANEL WITH GFR
Anion gap: 13 (ref 5–15)
BUN: 9 mg/dL (ref 6–20)
CO2: 22 mmol/L (ref 22–32)
Calcium: 8.9 mg/dL (ref 8.9–10.3)
Chloride: 102 mmol/L (ref 98–111)
Creatinine, Ser: 0.61 mg/dL (ref 0.44–1.00)
GFR, Estimated: >60 mL/min (ref >60)
Glucose, Bld: 83 mg/dL (ref 70–99)
Potassium: 3.2 mmol/L — ABNORMAL LOW (ref 3.5–5.1)
Sodium: 137 mmol/L (ref 135–145)

## 2023-10-05 LAB — WET PREP, GENITAL
Clue Cells Wet Prep HPF POC: NONE SEEN
Sperm: NONE SEEN
Trich, Wet Prep: NONE SEEN
WBC, Wet Prep HPF POC: <10 (ref <10)

## 2023-10-05 LAB — HCG, QUANTITATIVE, PREGNANCY: hCG, Beta Chain, Quant, S: 142847 m[IU]/mL — ABNORMAL HIGH (ref <5)

## 2023-10-05 NOTE — ED Provider Notes (Signed)
 [place] [facility]Y DEPARTMENT AT [facility] Provider Note   CSN: [id number] Arrival date & time: 10/05/23  1952     Patient presents with: Vaginal Bleeding   Renee [name] is a 23 y.o. female who presents emergency department with a chief complaint of vaginal bleeding.  She began having cramping and pain this morning.  She is known to be about [redacted] weeks gestation and was first aware of her pregnancy when she was seen for pelvic pain in the emergency department approximately 5 days ago.  She had a vaginal ultrasound that showed single IUP with gestational sac and was not bleeding at that time.  Patient also recently went for vaginal discharge system to an urgent care she did not have a pregnancy test at that time but was noted to have BV and yeast.  Note states that she was given Diflucan however patient reports that they gave her vaginal inserts which she has completed.  She does feel she has some vaginal irritation she denies any active pelvic pain at this time and is only having mild spotting.  Patient offered pelvic examination but states she is seeing her GYN in about 10 days and will defer her vaginal exam till then but would like to reswab for vaginal irritation.  {Add pertinent medical, surgical, social history, OB history to WUJ:81191}  Vaginal Bleeding      Prior to Admission medications   Not on File    Allergies: Patient has no known allergies.    Review of Systems  Genitourinary:  Positive for vaginal bleeding.    Updated Vital Signs BP (!) 109/57 (BP Location: Right Arm)   Pulse 95   Temp 99.3 F (37.4 C) (Oral)   Resp 18   Ht 5' 1 (1.549 m)   Wt 68 kg   LMP 08/01/2023 (Approximate)   SpO2 98%   BMI 28.34 kg/m   Physical Exam Vitals and nursing note reviewed.  Constitutional:      General: She is not in acute distress.    Appearance: She is well-developed. She is not diaphoretic.  HENT:     Head: Normocephalic and atraumatic.     Right  Ear: External ear normal.     Left Ear: External ear normal.     Nose: Nose normal.     Mouth/Throat:     Mouth: Mucous membranes are moist.   Eyes:     General: No scleral icterus.    Conjunctiva/sclera: Conjunctivae normal.    Cardiovascular:     Rate and Rhythm: Normal rate and regular rhythm.     Heart sounds: Normal heart sounds. No murmur heard.    No friction rub. No gallop.  Pulmonary:     Effort: Pulmonary effort is normal. No respiratory distress.     Breath sounds: Normal breath sounds.  Abdominal:     General: Bowel sounds are normal. There is no distension.     Palpations: Abdomen is soft. There is no mass.     Tenderness: There is no abdominal tenderness. There is no guarding.   Musculoskeletal:     Cervical back: Normal range of motion.   Skin:    General: Skin is warm and dry.   Neurological:     Mental Status: She is alert and oriented to person, place, and time.   Psychiatric:        Behavior: Behavior normal.    (all labs ordered are listed, but only abnormal results are displayed) Labs Reviewed  HCG,  QUANTITATIVE, PREGNANCY - Abnormal; Notable for the following components:      Result Value   hCG, Beta Chain, Quant, S [id number] (*)    All other components within normal limits  CBC - Abnormal; Notable for the following components:   WBC 11.9 (*)    Hemoglobin 11.4 (*)    HCT 33.3 (*)    All other components within normal limits  BASIC METABOLIC PANEL WITH GFR - Abnormal; Notable for the following components:   Potassium 3.2 (*)    All other components within normal limits  WET PREP, GENITAL  URINALYSIS, ROUTINE W REFLEX MICROSCOPIC  HIV ANTIBODY (ROUTINE TESTING W REFLEX)  GC/CHLAMYDIA PROBE AMP ([place]) NOT AT [facility]    EKG: None  Radiology: US  OB LESS THAN 14 WEEKS WITH OB TRANSVAGINAL Result Date: 10/05/2023 EXAM: ULTRASOUND FIRST TRIMESTER CLINICAL HISTORY: Vaginal bleeding x 1 day with cramps. Bleeding has subsided now per pt.  TECHNIQUE: Transabdominal and transvaginal first trimester obstetric pelvic duplex ultrasound was performed with real-time imaging, color flow Doppler imaging, and spectral analysis. COMPARISON: None provided. FINDINGS: UTERUS: No focal myometrial mass. GESTATIONAL SAC(S): Single intrauterine gestational sac with small subchorionic hemorrhage. YOLK SAC: Present EMBRYO(<11WK) /FETUS(>=11WK): Single embryo CROWN RUMP LENGTH: 22.5 mm (8 weeks 6 days) RATE OF CARDIAC ACTIVITY: Fetal heart rate 145 beats per minute. RIGHT OVARY: Within normal limits. LEFT OVARY: Notable for a 2.7 cm corpus luteum, physiologic. FREE FLUID: No free fluid. MEASUREMENTS ESTIMATED GESTATIONAL AGE BY CURRENT ULTRASOUND: 8 weeks 6 days ESTIMATED DUE DATE: 05/10/2024 IMPRESSION: 1. Single intrauterine gestational sac with yolk sac and embryo, consistent with 8 weeks 6 days gestation. Electronically signed by: Zadie Herter MD 10/05/2023 09:11 PM EDT RP Workstation: [id number]    {Document cardiac monitor, telemetry assessment procedure when appropriate:32947} Procedures   Medications Ordered in the ED - No data to display    {Click here for ABCD2, HEART and other calculators REFRESH Note before signing:1}                              Medical Decision Making Amount and/or Complexity of Data Reviewed Labs: ordered. Radiology: ordered.   This patient presents to the ED for concern of vaginal bleeding in early pregnancy, this involves an extensive number of treatment options, and is a complaint that carries with it a high risk of complications and morbidity.  The differential diagnosis for vaginal bleeding in pregnancy less than 20 weeks includes but is not limited to the following: Ectopic pregnancy, Subchorionic hematoma, First Trimester Abortion, Gestational trophoblastic disease, Heterotopic pregnancy, Implantation bleeding, Molar pregnancy, Cervicitis, Fibroids, Vaginal Trauma   Co morbidities: . currently  pregnant  Social Determinants of Health:  . SDOH Screenings   Depression (PHQ2-9): Low Risk  (05/20/2023)  Tobacco Use: Low Risk  (10/05/2023)  Recent Concern: Tobacco Use - High Risk (08/16/2023)   Received from [facility]     Additional history:  {Additional history obtained from ***} {External records from outside source obtained and reviewed including}  Lab Tests:  I Ordered, and personally interpreted labs.  The pertinent results include:   Lab tests ordered show vaginal yeast, quantitative hCG steadily rising consistent with pregnancy and estimated age.  CBC with mildly elevated blood count of insignificant value, potassium mildly low of insignificant value.  Imaging Studies:  I ordered imaging studies including vaginal ultrasound I independently visualized and interpreted imaging which showed single IUP estimated 8 weeks 6 days with  I agree with the radiologist interpretation  Cardiac Monitoring/ECG:  .The patient was maintained on a cardiac monitor.  I personally viewed and interpreted the cardiac monitored which showed an underlying rhythm of: ***  Medicines ordered and prescription drug management:  I ordered medication including Medications - No data to display for *** Reevaluation of the patient after these medicines showed that the patient {resolved/improved/worsened:23923::improved} I have reviewed the patients home medicines and have made adjustments as needed  Test Considered:  .***  Critical Interventions:  .***  Consultations Obtained: ***  Problem List / ED Course:  .   ICD-10-CM   1. Vaginal bleeding in pregnancy  O46.90     2. Subchorionic hemorrhage in first trimester  O20.8     3. Vaginal yeast infection  B37.31       MDM: ***   Dispostion:  After consideration of the diagnostic results and the patients response to treatment, I feel that the patent would benefit from ***.   {Document critical care time when appropriate   Document review of labs and clinical decision tools ie CHADS2VASC2, etc  Document your independent review of radiology images and any outside records  Document your discussion with family members, caretakers and with consultants  Document social determinants of health affecting pt's care  Document your decision making why or why not admission, treatments were needed:32947:::1}   Final diagnoses:  None    ED Discharge Orders     None

## 2023-10-05 NOTE — Discharge Instructions (Addendum)
 A subchorionic hemorrhage [facility]) is a collection of blood between the wall of the uterus and the sac that holds the baby in early pregnancy. It is often found on ultrasound, sometimes after light vaginal bleeding, but can also be seen when there are no symptoms. [facility] is a common finding and can be scary, but most women with [facility] go on to have healthy pregnancies.[1-9]  Follow these instructions at home: Nothing in the vagina except for the cream applicator for treatment of your vaginal yeast infection. Do not lift anything over 10 pounds No sexual intercourse Follow closely with your OB/GYN  When to seek immediate care: Seek medical attention right away if any of the following occur:  Heavy vaginal bleeding (soaking through a pad in an hour or less)  Severe abdominal pain or cramping  Passing large clots or tissue  Dizziness, fainting, or feeling very weak  Fever or chills These symptoms could mean a miscarriage or another serious problem, such as an ectopic pregnancy, and need urgent evaluation.[10] What to expect:  Most SCHs get smaller or go away on their own as the pregnancy continues.[3][5]  Regular prenatal care and follow-up ultrasounds are important.  Try to avoid strenuous activity if advised, but normal daily activities are usually safe unless told otherwise. If there are any questions or new symptoms, contact the healthcare provider for advice and support.

## 2023-10-05 NOTE — ED Triage Notes (Signed)
 Pt approx [redacted] wks pregnant; sts she wants medication for morning sickness and she is having some vaginal bleeding and cramping in lower back and pelvic area today

## 2023-10-05 NOTE — ED Notes (Signed)
 Patient transported to Ultrasound

## 2023-10-06 LAB — HIV ANTIBODY (ROUTINE TESTING W REFLEX): HIV Screen 4th Generation wRfx: NONREACTIVE

## 2023-10-07 LAB — GC/CHLAMYDIA PROBE AMP (~~LOC~~) NOT AT ARMC
Chlamydia: NEGATIVE
Comment: NEGATIVE
Comment: NORMAL
Neisseria Gonorrhea: NEGATIVE

## 2024-02-29 ENCOUNTER — Encounter (HOSPITAL_BASED_OUTPATIENT_CLINIC_OR_DEPARTMENT_OTHER): Payer: Self-pay | Admitting: Emergency Medicine

## 2024-02-29 ENCOUNTER — Other Ambulatory Visit: Payer: Self-pay

## 2024-02-29 ENCOUNTER — Emergency Department (HOSPITAL_BASED_OUTPATIENT_CLINIC_OR_DEPARTMENT_OTHER)
Admission: EM | Admit: 2024-02-29 | Discharge: 2024-02-29 | Disposition: A | Discharge status: Home / Self Care | Attending: Emergency Medicine | Admitting: Emergency Medicine

## 2024-02-29 DIAGNOSIS — Z3A30 30 weeks gestation of pregnancy: Secondary | ICD-10-CM | POA: Insufficient documentation

## 2024-02-29 DIAGNOSIS — O2393 Unspecified genitourinary tract infection in pregnancy, third trimester: Secondary | ICD-10-CM | POA: Insufficient documentation

## 2024-02-29 DIAGNOSIS — N750 Cyst of Bartholin's gland: Secondary | ICD-10-CM | POA: Insufficient documentation

## 2024-02-29 DIAGNOSIS — O26893 Other specified pregnancy related conditions, third trimester: Secondary | ICD-10-CM | POA: Diagnosis present

## 2024-02-29 MED ORDER — LIDOCAINE-EPINEPHRINE-TETRACAINE (LET) TOPICAL GEL
3.0000 mL | Freq: Once | TOPICAL | Status: AC
  Administered 2024-02-29: 3 mL via TOPICAL
  Filled 2024-02-29: qty 3

## 2024-02-29 NOTE — ED Triage Notes (Signed)
 Pt c/o Bartholin cyst to LT labia; hx of same; [redacted] wks pregnant

## 2024-02-29 NOTE — Discharge Instructions (Signed)
 Thank you for visiting the Emergency Department today. It was a pleasure to be part of your healthcare team.  You had a Bartholin cyst that was drained during your visit.  You were treated with topical lidocaine , and are advised to continue antibiotics that were given by OB/GYN for the Bartholin cyst.  Please follow-up with your OB/GYN Monday morning to let them know that you were seen in the ED over the weekend for cyst drainage.  At home, rest, utilize warm compresses, and Tylenol  as needed for pain. It is important to watch for warning signs such as worsening pain, fever, or swelling.  If any of these happen, return to the Emergency Department or call 911. Thank you for trusting us  with your health.

## 2024-02-29 NOTE — ED Notes (Signed)
 FHT assessed @138bpm .

## 2024-02-29 NOTE — ED Provider Notes (Signed)
 [name] [facility] Provider Note   CSN: [id number] Arrival date & time: 02/29/24  1705     Patient presents with: Cyst   Renee [name] is a 23 y.o. female with a history of recurrent bartholin cysts, currently on on an antibiotic regimen for same by her OB/GYN, presents to the ED with increased swelling and pain to a bartholin cyst on her left labia.   Patient states she has a history of multiple cyst drainages on her left Bartholin gland, and is scheduled for surgery postpartum.  Patient was seen yesterday at her OB/GYN, who she stated relayed that the cyst appeared to be draining on its own and gave her an outpatient antibiotic regimen to start.  The patient states that the cyst has increased in size, is more painful, and has stopped draining. Patient is G2P0, [redacted] weeks pregnant, without pregnancy complications.  Patient is requesting drainage for pain relief.     HPI     Prior to Admission medications   Not on File    Allergies: Patient has no known allergies.    Review of Systems  Genitourinary:        Bartholin cyst    Updated Vital Signs BP (!) 98/53   Pulse 88   Temp 99.1 F (37.3 C) (Oral)   Resp 15   Ht 5' 1 (1.549 m)   Wt 62.6 kg   LMP 08/01/2023 (Approximate)   SpO2 98%   BMI 26.07 kg/m   Physical Exam Vitals and nursing note reviewed. Exam conducted with a chaperone present.  Constitutional:      General: She is not in acute distress.    Appearance: Normal appearance.  HENT:     Head: Normocephalic and atraumatic.  Eyes:     Extraocular Movements: Extraocular movements intact.     Conjunctiva/sclera: Conjunctivae normal.     Pupils: Pupils are equal, round, and reactive to light.  Cardiovascular:     Rate and Rhythm: Normal rate and regular rhythm.     Pulses: Normal pulses.  Pulmonary:     Effort: Pulmonary effort is normal. No respiratory distress.  Abdominal:     Palpations: Abdomen is soft.      Tenderness: There is no abdominal tenderness.     Comments: Exam consistent with a 30-week intrauterine pregnancy with no acute abdominal findings.  Genitourinary:    General: Normal vulva.     Labia:        Left: Tenderness present.      Comments: Large Bartholin gland cyst, approximately 1.5 cm in diameter, that is erythematous and fluctuant noted to the left labia. TTP.  Musculoskeletal:        General: Normal range of motion.     Cervical back: Normal range of motion.  Skin:    General: Skin is warm and dry.     Capillary Refill: Capillary refill takes less than 2 seconds.  Neurological:     General: No focal deficit present.     Mental Status: She is alert. Mental status is at baseline.  Psychiatric:        Mood and Affect: Mood normal.     (all labs ordered are listed, but only abnormal results are displayed) Labs Reviewed - No data to display  EKG: None  Radiology: No results found.   .Incision and Drainage  Date/Time: 02/29/2024 7:32 PM  Performed by: Willma Duwaine CROME, PA Authorized by: Willma Duwaine CROME, PA   Consent:  Consent obtained:  Verbal   Consent given by:  Patient   Risks, benefits, and alternatives were discussed: yes     Risks discussed:  Bleeding, incomplete drainage, pain, infection and damage to other organs   Alternatives discussed:  No treatment and delayed treatment Universal protocol:    Procedure explained and questions answered to patient or proxy's satisfaction: yes     Relevant documents present and verified: yes     Test results available : no     Imaging studies available: no     Required blood products, implants, devices, and special equipment available: no     Site/side marked: yes     Immediately prior to procedure, a time out was called: yes     Patient identity confirmed:  Verbally with patient, hospital-assigned identification number and arm band Location:    Type:  Bartholin cyst   Size:  1.5 inch diameter   Location:   Anogenital   Anogenital location:  Bartholin's gland Pre-procedure details:    Skin preparation:  Povidone-iodine Sedation:    Sedation type:  None Anesthesia:    Anesthesia method:  Topical application   Topical anesthetic:  LET Procedure type:    Complexity:  Simple Procedure details:    Ultrasound guidance: no     Incision types:  Stab incision   Incision depth:  Subcutaneous   Techniques: open and drained.   Drainage:  Purulent   Drainage amount:  Copious   Wound treatment:  Wound left open   Packing materials:  None Post-procedure details:    Procedure completion:  Tolerated Comments:     Patient would not tolerate saline irrigation or packing during this procedure. Cyst was fully drained and left open.    Medications Ordered in the ED  lidocaine -EPINEPHrine -tetracaine (LET) topical gel (3 mLs Topical Given 02/29/24 1827)                                    Medical Decision Making  Patient presents to the ED for concern of bartholin cyst on her left labia --   Co morbidities that complicate the patient evaluation: Pregnancy  Additional history obtained: Additional history obtained from Outside Medical Records  External records from outside source obtained and reviewed including medical history, surgical history, medications, allergies.  Prior records from patient's OB/GYN was reviewed regarding  a recurrent Bartholin cyst on her left labia.  Patient's current antibiotic regimen was reviewed. The patient was a reliable historian, providing a clear, detailed, and consistent account of the presenting symptoms and relevant medical history. The information was obtained directly from the patient and statements were documented in the patient's own words when possible. No discrepancies were noted between the history provided and available collateral sources.     Medicines ordered and prescription drug management: I ordered medications: LET for topical  anesthetic Reevaluation of the patient after these medicines showed that the patient improved I have reviewed the patients home medicines and have made adjustments as needed  Test Considered: Diagnostic testing was considered based on the patient's presenting symptoms, risk factors, and initial clinical assessment.  Ultrasound imaging was considered before I&D of the Bartholin cyst, however given the superficial location of the Bartholin cyst, ultrasound was deemed not necessary.  The approach to diagnostic testing prioritized exclusion of life-threatening conditions  Problem List / ED Course: Problem List: Bartholin cyst Emergency Department Course: The patient presented with increased pain  and swelling to a bartholin cyst that she is currently on antibiotic regimen for. Initial assessment included history, physical exam, and review of prior medical records.  Upon initial exam, large bartholin cyst noted to the left labia - cyst was approximately 1.5 inches in diameter, erythematous, and fluctuant. LET topical gel was applied, and cyst was drained.  Patient tolerated drainage well, however would not tolerate irrigation or packing.  Cyst was fully drained of purulent discharge, cleaned, and left open.  Patient was educated on proper home care. Fetal heart tones were monitored during visit. All vitals remained WNL. 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 Reevaluation: After the interventions noted above, I reevaluated the patient and found that they have :improved.  Patient stated she had significant pain relief with the cyst drainage.  Dispostion: After consideration of the procedure results and the patients response, I feel  that the patent would benefit from discharge home with supportive care measures, antibiotic regimen placed by OB/GYN yesterday, and close follow-up with her OB/GYN for further evaluation and care on Monday. Clinical Assessment:    Working diagnosis: Bartholin cyst, I&D Disposition Plan: The patient is medically stable for discharge from the Emergency Department at this time. Vital signs are within normal limits, and the patient is alert, oriented, and in no acute distress.  Evaluation has been completed with no findings necessitating hospital admission or further emergent workup.  Communication:   Patient was informed of disposition decision and rationale. Questions addressed.  The clinical impression was discussed with the patient and the patient demonstrated understanding.     Final diagnoses:  Bartholin cyst    ED Discharge Orders     None          Willma Duwaine CROME, [place] 02/29/24 2055    Franklyn Sid SAILOR, MD 02/29/24 2202
